# Patient Record
Sex: Male | Born: 1935 | Race: Black or African American | Hispanic: No | Marital: Married | Smoking: Never smoker
Health system: Southern US, Community
[De-identification: ages and names within clinical notes are randomized; demographics above are authoritative.]

## PROBLEM LIST (undated history)

## (undated) DIAGNOSIS — I4891 Unspecified atrial fibrillation: Secondary | ICD-10-CM

## (undated) DIAGNOSIS — I10 Essential (primary) hypertension: Secondary | ICD-10-CM

---

## 2010-12-29 ENCOUNTER — Ambulatory Visit: Payer: Self-pay | Admitting: Unknown Physician Specialty

## 2011-01-05 ENCOUNTER — Observation Stay: Payer: Self-pay | Admitting: Urology

## 2011-01-07 LAB — PATHOLOGY REPORT

## 2014-11-01 ENCOUNTER — Ambulatory Visit: Payer: Self-pay

## 2014-11-01 ENCOUNTER — Encounter (INDEPENDENT_AMBULATORY_CARE_PROVIDER_SITE_OTHER): Payer: Self-pay

## 2018-05-17 ENCOUNTER — Emergency Department: Payer: Medicare Other

## 2018-05-17 ENCOUNTER — Other Ambulatory Visit: Payer: Self-pay

## 2018-05-17 ENCOUNTER — Inpatient Hospital Stay
Admission: EM | Admit: 2018-05-17 | Discharge: 2018-05-23 | DRG: 309 | Disposition: A | Discharge status: Skilled Nursing Facility | Payer: Medicare Other | Attending: Internal Medicine | Admitting: Internal Medicine

## 2018-05-17 ENCOUNTER — Encounter: Payer: Self-pay | Admitting: Emergency Medicine

## 2018-05-17 DIAGNOSIS — Z7901 Long term (current) use of anticoagulants: Secondary | ICD-10-CM

## 2018-05-17 DIAGNOSIS — I959 Hypotension, unspecified: Secondary | ICD-10-CM | POA: Diagnosis not present

## 2018-05-17 DIAGNOSIS — S82141A Displaced bicondylar fracture of right tibia, initial encounter for closed fracture: Secondary | ICD-10-CM | POA: Diagnosis present

## 2018-05-17 DIAGNOSIS — I4819 Other persistent atrial fibrillation: Secondary | ICD-10-CM

## 2018-05-17 DIAGNOSIS — N179 Acute kidney failure, unspecified: Secondary | ICD-10-CM | POA: Diagnosis present

## 2018-05-17 DIAGNOSIS — M25061 Hemarthrosis, right knee: Secondary | ICD-10-CM | POA: Diagnosis present

## 2018-05-17 DIAGNOSIS — E785 Hyperlipidemia, unspecified: Secondary | ICD-10-CM | POA: Diagnosis present

## 2018-05-17 DIAGNOSIS — Z8249 Family history of ischemic heart disease and other diseases of the circulatory system: Secondary | ICD-10-CM | POA: Diagnosis not present

## 2018-05-17 DIAGNOSIS — R531 Weakness: Secondary | ICD-10-CM | POA: Diagnosis present

## 2018-05-17 DIAGNOSIS — Z1159 Encounter for screening for other viral diseases: Secondary | ICD-10-CM

## 2018-05-17 DIAGNOSIS — I5032 Chronic diastolic (congestive) heart failure: Secondary | ICD-10-CM | POA: Diagnosis present

## 2018-05-17 DIAGNOSIS — R55 Syncope and collapse: Secondary | ICD-10-CM | POA: Diagnosis present

## 2018-05-17 DIAGNOSIS — R609 Edema, unspecified: Secondary | ICD-10-CM

## 2018-05-17 DIAGNOSIS — Z7984 Long term (current) use of oral hypoglycemic drugs: Secondary | ICD-10-CM

## 2018-05-17 DIAGNOSIS — E119 Type 2 diabetes mellitus without complications: Secondary | ICD-10-CM | POA: Diagnosis present

## 2018-05-17 DIAGNOSIS — I110 Hypertensive heart disease with heart failure: Secondary | ICD-10-CM | POA: Diagnosis present

## 2018-05-17 DIAGNOSIS — R52 Pain, unspecified: Secondary | ICD-10-CM

## 2018-05-17 DIAGNOSIS — I482 Chronic atrial fibrillation, unspecified: Principal | ICD-10-CM | POA: Diagnosis present

## 2018-05-17 HISTORY — DX: Essential (primary) hypertension: I10

## 2018-05-17 LAB — COMPREHENSIVE METABOLIC PANEL
ALT: 20 U/L (ref 0–44)
AST: 20 U/L (ref 15–41)
Albumin: 3.8 g/dL (ref 3.5–5.0)
Alkaline Phosphatase: 91 U/L (ref 38–126)
Anion gap: 11 (ref 5–15)
BUN: 16 mg/dL (ref 8–23)
CO2: 26 mmol/L (ref 22–32)
Calcium: 9.3 mg/dL (ref 8.9–10.3)
Chloride: 102 mmol/L (ref 98–111)
Creatinine, Ser: 1.38 mg/dL — ABNORMAL HIGH (ref 0.61–1.24)
GFR calc Af Amer: 55 mL/min — ABNORMAL LOW (ref >60)
GFR calc non Af Amer: 48 mL/min — ABNORMAL LOW (ref >60)
Glucose, Bld: 129 mg/dL — ABNORMAL HIGH (ref 70–99)
Potassium: 4.2 mmol/L (ref 3.5–5.1)
Sodium: 139 mmol/L (ref 135–145)
Total Bilirubin: 0.8 mg/dL (ref 0.3–1.2)
Total Protein: 8.1 g/dL (ref 6.5–8.1)

## 2018-05-17 LAB — CBC WITH DIFFERENTIAL/PLATELET
Abs Immature Granulocytes: 0.03 10*3/uL (ref 0.00–0.07)
Basophils Absolute: 0 10*3/uL (ref 0.0–0.1)
Basophils Relative: 0 %
Eosinophils Absolute: 0.2 10*3/uL (ref 0.0–0.5)
Eosinophils Relative: 2 %
HCT: 46.3 % (ref 39.0–52.0)
Hemoglobin: 15.5 g/dL (ref 13.0–17.0)
Immature Granulocytes: 0 %
Lymphocytes Relative: 10 %
Lymphs Abs: 1.1 10*3/uL (ref 0.7–4.0)
MCH: 28.9 pg (ref 26.0–34.0)
MCHC: 33.5 g/dL (ref 30.0–36.0)
MCV: 86.2 fL (ref 80.0–100.0)
Monocytes Absolute: 0.8 10*3/uL (ref 0.1–1.0)
Monocytes Relative: 7 %
Neutro Abs: 8.9 10*3/uL — ABNORMAL HIGH (ref 1.7–7.7)
Neutrophils Relative %: 81 %
Platelets: 105 10*3/uL — ABNORMAL LOW (ref 150–400)
RBC: 5.37 MIL/uL (ref 4.22–5.81)
RDW: 14.1 % (ref 11.5–15.5)
WBC: 11.1 10*3/uL — ABNORMAL HIGH (ref 4.0–10.5)
nRBC: 0 % (ref 0.0–0.2)

## 2018-05-17 LAB — PROTIME-INR
INR: 1.6 — ABNORMAL HIGH (ref 0.8–1.2)
Prothrombin Time: 19.1 seconds — ABNORMAL HIGH (ref 11.4–15.2)

## 2018-05-17 LAB — TROPONIN I
Troponin I: <0.03 ng/mL (ref <0.03)
Troponin I: <0.03 ng/mL (ref <0.03)

## 2018-05-17 LAB — ETHANOL: Alcohol, Ethyl (B): <10 mg/dL (ref <10)

## 2018-05-17 MED ORDER — ONDANSETRON HCL 4 MG/2ML IJ SOLN: 4.0000 mg | Freq: Four times a day (QID) | INTRAMUSCULAR | Status: DC | PRN

## 2018-05-17 MED ORDER — FUROSEMIDE 20 MG PO TABS
20.0000 mg | ORAL_TABLET | Freq: Every day | ORAL | Status: DC
  Administered 2018-05-17 – 2018-05-23 (×7): 20 mg via ORAL
  Filled 2018-05-17 (×7): qty 1

## 2018-05-17 MED ORDER — DILTIAZEM HCL 100 MG IV SOLR
5.0000 mg/h | INTRAVENOUS | Status: DC
  Administered 2018-05-17: 16:00:00 5 mg/h via INTRAVENOUS
  Administered 2018-05-18: 10 mg/h via INTRAVENOUS
  Administered 2018-05-18: 7.5 mg/h via INTRAVENOUS
  Filled 2018-05-17 (×2): qty 100

## 2018-05-17 MED ORDER — ACETAMINOPHEN 325 MG PO TABS
650.0000 mg | ORAL_TABLET | Freq: Four times a day (QID) | ORAL | Status: DC | PRN
  Administered 2018-05-19: 650 mg via ORAL
  Filled 2018-05-17: qty 2

## 2018-05-17 MED ORDER — DILTIAZEM HCL 25 MG/5ML IV SOLN
5.0000 mg | Freq: Once | INTRAVENOUS | Status: AC
  Administered 2018-05-17: 15:00:00 5 mg via INTRAVENOUS

## 2018-05-17 MED ORDER — RIVAROXABAN 20 MG PO TABS
20.0000 mg | ORAL_TABLET | Freq: Every day | ORAL | Status: DC
  Administered 2018-05-17: 20 mg via ORAL
  Filled 2018-05-17: qty 1

## 2018-05-17 MED ORDER — DILTIAZEM HCL 25 MG/5ML IV SOLN
5.0000 mg | Freq: Once | INTRAVENOUS | Status: AC
  Administered 2018-05-17: 14:00:00 5 mg via INTRAVENOUS
  Filled 2018-05-17: qty 5

## 2018-05-17 MED ORDER — PRAVASTATIN SODIUM 20 MG PO TABS
20.0000 mg | ORAL_TABLET | Freq: Every day | ORAL | Status: DC
  Administered 2018-05-18 – 2018-05-22 (×5): 20 mg via ORAL
  Filled 2018-05-17 (×5): qty 1

## 2018-05-17 MED ORDER — DILTIAZEM HCL 25 MG/5ML IV SOLN
10.0000 mg | Freq: Once | INTRAVENOUS | Status: AC
  Administered 2018-05-17: 16:00:00 10 mg via INTRAVENOUS
  Filled 2018-05-17: qty 5

## 2018-05-17 MED ORDER — ACETAMINOPHEN 650 MG RE SUPP: 650.0000 mg | Freq: Four times a day (QID) | RECTAL | Status: DC | PRN

## 2018-05-17 MED ORDER — ONDANSETRON HCL 4 MG PO TABS: 4.0000 mg | ORAL_TABLET | Freq: Four times a day (QID) | ORAL | Status: DC | PRN

## 2018-05-17 MED ORDER — POLYETHYLENE GLYCOL 3350 17 G PO PACK: 17.0000 g | PACK | Freq: Every day | ORAL | Status: DC | PRN

## 2018-05-17 NOTE — Progress Notes (Signed)
Family Meeting Note  Advance Directive:yes  Today a meeting took place with the Patient.  Patient is able to participate.  The following clinical team members were present during this meeting:MD  The following were discussed:Patient's diagnosis: A-fib with RVR, Patient's progosis: Unable to determine and Goals for treatment: Full Code  Additional follow-up to be provided: prn  Time spent during discussion:20 minutes  Peter Sanchez D Lennis Rader, MD  

## 2018-05-17 NOTE — ED Provider Notes (Addendum)
[facility] Emergency Department Provider Note  ____________________________________________   First MD Initiated Contact with Patient 05/17/18 1541     (approximate)  I have reviewed the triage vital signs and the nursing notes.   HISTORY  Chief Complaint Near Syncope    HPI Peter Sanchez is a 84 y.o. male presents emergency department complaining of a syncopal episode while at home.  Patient's wife states that he was sitting on a walker and urinated on himself.  He has a history of chronic A. fib.  He denies any chest pain or shortness of breath.  He denies any nausea vomiting.    History reviewed. No pertinent past medical history.  There are no active problems to display for this patient.   History reviewed. No pertinent surgical history.  Prior to Admission medications   Not on File    Allergies Patient has no allergy information on record.  History reviewed. No pertinent family history.  Social History Social History   Tobacco Use  . Smoking status: Never Smoker  . Smokeless tobacco: Never Used  Substance Use Topics  . Alcohol use: Never    Frequency: Never  . Drug use: Never    Review of Systems  Constitutional: No fever/chills, positive syncopal episode Eyes: No visual changes. ENT: No sore throat. Respiratory: Denies cough Neovascular: Positive for history of A. fib Genitourinary: Negative for dysuria. Musculoskeletal: Negative for back pain. Skin: Negative for rash.    ____________________________________________   PHYSICAL EXAM:  VITAL SIGNS: ED Triage Vitals  Enc Vitals Group     BP 05/17/18 1400 (!) 147/110     Pulse Rate 05/17/18 1404 (!) 120     Resp 05/17/18 1400 18     Temp 05/17/18 1404 98.1 F (36.7 C)     Temp src --      SpO2 05/17/18 1401 100 %     Weight 05/17/18 1405 178 lb (80.7 kg)     Height 05/17/18 1405 5\' 8"  (1.727 m)     Head Circumference --      Peak Flow --      Pain Score  05/17/18 1405 0     Pain Loc --      Pain Edu? --      Excl. in GC? --     Constitutional: Alert and oriented. Well appearing and in no acute distress. Eyes: Conjunctivae are normal.  Head: Atraumatic. Nose: No congestion/rhinnorhea. Mouth/Throat: Mucous membranes are moist.   Neck:  supple no lymphadenopathy noted Cardiovascular: Irregular rate and rhythm heart sounds are normal Respiratory: Normal respiratory effort.  No retractions, lungs c t a  Abd: soft nontender bs normal all 4 quad GU: deferred Musculoskeletal: FROM all extremities, warm and well perfused Neurologic:  Normal speech and language.  Skin:  Skin is warm, dry and intact. No rash noted. Psychiatric: Mood and affect are normal. Speech and behavior are normal.  ____________________________________________   LABS (all labs ordered are listed, but only abnormal results are displayed)  Labs Reviewed  COMPREHENSIVE METABOLIC PANEL - Abnormal; Notable for the following components:      Result Value   Glucose, Bld 129 (*)    Creatinine, Ser 1.38 (*)    GFR calc non Af Amer 48 (*)    GFR calc Af Amer 55 (*)    All other components within normal limits  CBC WITH DIFFERENTIAL/PLATELET - Abnormal; Notable for the following components:   WBC 11.1 (*)    Platelets 105 (*)  Neutro Abs 8.9 (*)    All other components within normal limits  PROTIME-INR - Abnormal; Notable for the following components:   Prothrombin Time 19.1 (*)    INR 1.6 (*)    All other components within normal limits  ETHANOL  TROPONIN I  TROPONIN I   ____________________________________________   ____________________________________________  RADIOLOGY  CT of the head is negative for any acute abnormality  ____________________________________________   PROCEDURES  Procedure(s) performed: EKG shows rapid A. Fib Cardizem 5 mg given, heart rate continued to be elevated, repeat the Cardizem 5 mg for total of 10, heart rate did  decrease   Procedures    ____________________________________________   INITIAL IMPRESSION / ASSESSMENT AND PLAN / ED COURSE  Pertinent labs & imaging results that were available during my care of the patient were reviewed by me and considered in my medical decision making (see chart for details).   Patient is a 84 year old male presents emergency department syncopal episode.  History of chronic A. Fib.  Patient is on Xarelto and metoprolol which he has not taken today  Physical exam patient is alert and oriented.  He is not complaining of any pain. EKG shows rapid  A. Fib  Discussed patient case with Dr. Darnelle Catalan.  He agrees that we need to start the patient on Cardizem.  Labs were ordered, CT the head was ordered   Troponin is normal, CBC is elevated WBC of 11.1, INR is 1.6, comprehensive metabolic panel is basically normal  Patient was given a total of 10 mg of Cardizem and his heart rate did decrease.  However I am rechecked with the patient about it 30 minutes later the heart rate had increased again.  Discussed this with Dr. Darnelle Catalan.  We gave patient another 10 mg of Cardizem and started him on a drip.  He instructed me to have him admitted. Discussed case with the hospitalist.  She agrees to admit the patient.  Critical care time due to the A. fib with increased heart rate and infusions of Cardizem with titrations, critical care time is approximately 1 hour.    As part of my medical decision making, I reviewed the following data within the electronic MEDICAL RECORD NUMBER Nursing notes reviewed and incorporated, Labs reviewed the above, EKG interpreted atrial fibrillation, Old chart reviewed, Radiograph reviewed CT of the head is normal, Discussed with admitting physician hospitalist, Notes from prior ED visits and [place] Controlled Substance Database  ____________________________________________   FINAL CLINICAL IMPRESSION(S) / ED DIAGNOSES  Final diagnoses:  Syncope,  unspecified syncope type  Persistent atrial fibrillation      NEW MEDICATIONS STARTED DURING THIS VISIT:  New Prescriptions   No medications on file     Note:  This document was prepared using Dragon voice recognition software and may include unintentional dictation errors.    Faythe Ghee, PA-C 05/17/18 1649    Faythe Ghee, PA-C 05/17/18 1656    Arnaldo Natal, MD 05/22/18 [phone]

## 2018-05-17 NOTE — ED Notes (Signed)
Patient transported to CT 

## 2018-05-17 NOTE — ED Notes (Signed)
Pt unable to recall events, pt is A/O x4 at this time. Pt st he has a hx of Afib and takes Xarelto daily.  Pt denies CP, SHOB, dizziness at this time.

## 2018-05-17 NOTE — ED Notes (Signed)
ED TO INPATIENT HANDOFF REPORT  ED Nurse Name and Phone #:  Sanya Kobrin [phone]  S Name/Age/Gender Peter Sanchez 84 y.o. male Room/Bed: ED18A/ED18A  Code Status   Code Status: Not on file  Home/SNF/Other Home Patient oriented to: self, place, time and situation Is this baseline? Yes   Triage Complete: Triage complete  Chief Complaint syncopal episode  Triage Note Pt from home via [facility]. Per EMS pt was found in kitchen sitting on walker and unable to answer questions. Per pt's wife, pt passed out sitting on walker; pt urinated on himself.  Per EMS pt on Afib; BP 139/83; CBg 134.    Allergies Not on File  Level of Care/Admitting Diagnosis ED Disposition    ED Disposition Condition Comment   Admit  Hospital Area: [facility] [[id number]]  Level of Care: Telemetry [5]  Covid Evaluation: N/A  Diagnosis: Chronic atrial fibrillation with RVR [[id number]]  Admitting Physician: Willadean Carol DODD [[id number]]  Attending Physician: Willadean Carol DODD [[id number]]  Estimated length of stay: past midnight tomorrow  Certification:: I certify this patient will need inpatient services for at least 2 midnights  PT Class (Do Not Modify): Inpatient [101]  PT Acc Code (Do Not Modify): Private [1]       B Medical/Surgery History History reviewed. No pertinent past medical history. History reviewed. No pertinent surgical history.   A IV Location/Drains/Wounds Patient Lines/Drains/Airways Status   Active Line/Drains/Airways    Name:   Placement date:   Placement time:   Site:   Days:   Peripheral IV 05/17/18 Right Wrist   05/17/18    1451    Wrist   less than 1          Intake/Output Last 24 hours No intake or output data in the 24 hours ending 05/17/18 1723  Labs/Imaging Results for orders placed or performed during the hospital encounter of 05/17/18 (from the past 48 hour(s))  Comprehensive metabolic panel     Status: Abnormal   Collection Time: 05/17/18  2:11 PM  Result  Value Ref Range   Sodium 139 135 - 145 mmol/L   Potassium 4.2 3.5 - 5.1 mmol/L   Chloride 102 98 - 111 mmol/L   CO2 26 22 - 32 mmol/L   Glucose, Bld 129 (H) 70 - 99 mg/dL   BUN 16 8 - 23 mg/dL   Creatinine, Ser 9.56 (H) 0.61 - 1.24 mg/dL   Calcium 9.3 8.9 - 21.3 mg/dL   Total Protein 8.1 6.5 - 8.1 g/dL   Albumin 3.8 3.5 - 5.0 g/dL   AST 20 15 - 41 U/L   ALT 20 0 - 44 U/L   Alkaline Phosphatase 91 38 - 126 U/L   Total Bilirubin 0.8 0.3 - 1.2 mg/dL   GFR calc non Af Amer 48 (L) >60 mL/min   GFR calc Af Amer 55 (L) >60 mL/min   Anion gap 11 5 - 15    Comment: Performed at [facility], [address] [place], [place] [address]  Ethanol     Status: None   Collection Time: 05/17/18  2:11 PM  Result Value Ref Range   Alcohol, Ethyl (B) <10 <10 mg/dL    Comment: (NOTE) Lowest detectable limit for serum alcohol is 10 mg/dL. For medical purposes only. Performed at [facility], [address], [place], [place] [address]   Troponin I - Once     Status: None   Collection Time: 05/17/18  2:11 PM  Result  Value Ref Range   Troponin I <0.03 <0.03 ng/mL    Comment: Performed at [facility], [address] [place], [place] [address]  CBC with Differential     Status: Abnormal   Collection Time: 05/17/18  2:11 PM  Result Value Ref Range   WBC 11.1 (H) 4.0 - 10.5 K/uL   RBC 5.37 4.22 - 5.81 MIL/uL   Hemoglobin 15.5 13.0 - 17.0 g/dL   HCT 60.446.3 54.039.0 - 98.152.0 %   MCV 86.2 80.0 - 100.0 fL   MCH 28.9 26.0 - 34.0 pg   MCHC 33.5 30.0 - 36.0 g/dL   RDW 19.114.1 47.811.5 - 29.515.5 %   Platelets 105 (L) 150 - 400 K/uL    Comment: Immature Platelet Fraction may be clinically indicated, consider ordering this additional test [id number]    nRBC 0.0 0.0 - 0.2 %   Neutrophils Relative % 81 %   Neutro Abs 8.9 (H) 1.7 - 7.7 K/uL   Lymphocytes Relative 10 %   Lymphs Abs 1.1 0.7 - 4.0 K/uL   Monocytes Relative 7 %   Monocytes Absolute 0.8 0.1 - 1.0 K/uL   Eosinophils Relative 2  %   Eosinophils Absolute 0.2 0.0 - 0.5 K/uL   Basophils Relative 0 %   Basophils Absolute 0.0 0.0 - 0.1 K/uL   WBC Morphology MORPHOLOGY UNREMARKABLE    RBC Morphology MORPHOLOGY UNREMARKABLE    Smear Review MORPHOLOGY UNREMARKABLE    Immature Granulocytes 0 %   Abs Immature Granulocytes 0.03 0.00 - 0.07 K/uL    Comment: Performed at [facility], [address] [place], [place] [address]  Protime-INR     Status: Abnormal   Collection Time: 05/17/18  2:11 PM  Result Value Ref Range   Prothrombin Time 19.1 (H) 11.4 - 15.2 seconds   INR 1.6 (H) 0.8 - 1.2    Comment: (NOTE) INR goal varies based on device and disease states. Performed at [facility], [address] [place], [place] [address]    Ct Head Wo Contrast  Result Date: 05/17/2018 CLINICAL DATA:  Altered level of consciousness. EXAM: CT HEAD WITHOUT CONTRAST TECHNIQUE: Contiguous axial images were obtained from the base of the skull through the vertex without intravenous contrast. COMPARISON:  None. FINDINGS: Brain: Mild diffuse cortical atrophy is noted. Mild chronic ischemic white matter disease is noted. No mass effect or midline shift is noted. Ventricular size is within normal limits. There is no evidence of mass lesion, hemorrhage or acute infarction. Vascular: No hyperdense vessel or unexpected calcification. Skull: Normal. Negative for fracture or focal lesion. Sinuses/Orbits: No acute finding. Other: None. IMPRESSION: Mild diffuse cortical atrophy. Mild chronic ischemic white matter disease. No acute intracranial abnormality seen. Electronically Signed   By: Lupita RaiderJames  Green Jr M.D.   On: 05/17/2018 15:15    Pending Labs Unresulted Labs (From admission, onward)    Start     Ordered   05/17/18 1716  Troponin I - Once  Once,   STAT     05/17/18 1416   Signed and Held  Basic metabolic panel  Tomorrow morning,   R     Signed and Held   Signed and Held  CBC  Tomorrow morning,   R     Signed and Held    Signed and Held  Troponin I - Now Then Q6H  Now then every 6 hours,   R     Signed and Held  Vitals/Pain Today's Vitals   05/17/18 1440 05/17/18 1445 05/17/18 1615 05/17/18 1642  BP: (!) 162/114 (!) 148/109 (!) 155/111 (!) 150/108  Pulse: 95 92 97 (!) 110  Resp: 16 17 16  (!) 25  Temp:      SpO2: 100% 94% 99% 100%  Weight:      Height:      PainSc:        Isolation Precautions No active isolations  Medications Medications  diltiazem (CARDIZEM) 100 mg in dextrose 5 % 100 mL (1 mg/mL) infusion (7.5 mg/hr Intravenous Rate/Dose Change 05/17/18 1644)  diltiazem (CARDIZEM) injection 5 mg (5 mg Intravenous Given 05/17/18 1427)  diltiazem (CARDIZEM) injection 5 mg (5 mg Intravenous Given 05/17/18 1432)  diltiazem (CARDIZEM) injection 10 mg (10 mg Intravenous Given 05/17/18 1604)    Mobility uta Moderate fall risk   Focused Assessments Cardiac Assessment Handoff:  Cardiac Rhythm: Atrial fibrillation Lab Results  Component Value Date   TROPONINI <0.03 05/17/2018   No results found for: DDIMER Does the Patient currently have chest pain? No     R Recommendations: See Admitting Provider Note  Report given to:   Additional Notes: no complaints at this time. Wife has been told he is in the hospital

## 2018-05-17 NOTE — ED Triage Notes (Addendum)
Pt from home via [facility]. Per EMS pt was found in kitchen sitting on walker and unable to answer questions. Per pt's wife, pt passed out sitting on walker; pt urinated on himself.  Per EMS pt on Afib; BP 139/83; CBg 134.

## 2018-05-17 NOTE — H&P (Signed)
[facility] [place] [facility]   PATIENT NAME: Peter Sanchez    MR#:  [id number]  DATE OF BIRTH:  06/15/1934  DATE OF ADMISSION:  05/17/2018  PRIMARY CARE PHYSICIAN: Patient, No Pcp Per   REQUESTING/REFERRING PHYSICIAN: Greig Right, PA  CHIEF COMPLAINT:   Chief Complaint  Patient presents with  . Near Syncope    HISTORY OF PRESENT ILLNESS:  Peter Sanchez  is a 84 y.o. male with a known history of paroxysmal atrial fibrillation who presented to the ED with a syncopal episode this morning.  He states he passed out while sitting in a chair.  The next thing he knows, he woke up in an ambulance.  He denied any chest pain, lightheadedness, palpitations, nausea, sweating prior to the episode of syncope.  He thinks he was out for about 30 minutes.  He denies any head trauma.  In the ED, he was found to be in A. fib with RVR with heart rates in the low 100s.  Labs were unremarkable.  CT head was negative for acute abnormalities.  He was started on a diltiazem drip and hospitalists were called for admission.  PAST MEDICAL HISTORY:  Chronic atrial fibrillation Hypertension Hyperlipidemia Chronic diastolic congestive heart failure  PAST SURGICAL HISTORY:  History reviewed. No pertinent surgical history.  SOCIAL HISTORY:   Social History   Tobacco Use  . Smoking status: Never Smoker  . Smokeless tobacco: Never Used  Substance Use Topics  . Alcohol use: Never    Frequency: Never    FAMILY HISTORY:  Father-heart attack  DRUG ALLERGIES:  Not on File  REVIEW OF SYSTEMS:   Review of Systems  Constitutional: Negative for chills and fever.  HENT: Negative for congestion and sore throat.   Eyes: Negative for blurred vision and double vision.  Respiratory: Negative for cough and shortness of breath.   Cardiovascular: Negative for chest pain, palpitations and leg swelling.  Gastrointestinal: Negative for abdominal pain, nausea and vomiting.   Genitourinary: Negative for dysuria and urgency.  Musculoskeletal: Negative for back pain and neck pain.  Neurological: Positive for loss of consciousness. Negative for dizziness, focal weakness, seizures and headaches.    MEDICATIONS AT HOME:   Prior to Admission medications   Not on File      VITAL SIGNS:  Blood pressure (!) 148/109, pulse 92, temperature 98.1 F (36.7 C), resp. rate 17, height 5\' 8"  (1.727 m), weight 80.7 kg, SpO2 94 %.  PHYSICAL EXAMINATION:  Physical Exam  GENERAL:  84 y.o.-year-old patient lying in the bed with no acute distress.  EYES: Pupils equal, round, reactive to light and accommodation. No scleral icterus. Extraocular muscles intact.  HEENT: Head atraumatic, normocephalic. Oropharynx and nasopharynx clear.  NECK:  Supple, no jugular venous distention. No thyroid enlargement, no tenderness.  LUNGS: Normal breath sounds bilaterally, no wheezing, rales,rhonchi or crepitation. No use of accessory muscles of respiration.  CARDIOVASCULAR: Irregularly irregular rhythm, tachycardic, S1, S2 normal. No murmurs, rubs, or gallops.  ABDOMEN: Soft, nontender, nondistended. Bowel sounds present. No organomegaly or mass.  EXTREMITIES: No pedal edema, cyanosis, or clubbing.  NEUROLOGIC: Cranial nerves II through XII are intact. Muscle strength 5/5 in all extremities. Sensation intact. Gait not checked.  PSYCHIATRIC: The patient is alert and oriented x 3.  SKIN: No obvious rash, lesion, or ulcer.   LABORATORY PANEL:   CBC Recent Labs  Lab 05/17/18 1411  WBC 11.1*  HGB 15.5  HCT 46.3  PLT 105*   ------------------------------------------------------------------------------------------------------------------  Chemistries  Recent Labs  Lab 05/17/18 1411  NA 139  K 4.2  CL 102  CO2 26  GLUCOSE 129*  BUN 16  CREATININE 1.38*  CALCIUM 9.3  AST 20  ALT 20  ALKPHOS 91  BILITOT 0.8    ------------------------------------------------------------------------------------------------------------------  Cardiac Enzymes Recent Labs  Lab 05/17/18 1411  TROPONINI <0.03   ------------------------------------------------------------------------------------------------------------------  RADIOLOGY:  Ct Head Wo Contrast  Result Date: 05/17/2018 CLINICAL DATA:  Altered level of consciousness. EXAM: CT HEAD WITHOUT CONTRAST TECHNIQUE: Contiguous axial images were obtained from the base of the skull through the vertex without intravenous contrast. COMPARISON:  None. FINDINGS: Brain: Mild diffuse cortical atrophy is noted. Mild chronic ischemic white matter disease is noted. No mass effect or midline shift is noted. Ventricular size is within normal limits. There is no evidence of mass lesion, hemorrhage or acute infarction. Vascular: No hyperdense vessel or unexpected calcification. Skull: Normal. Negative for fracture or focal lesion. Sinuses/Orbits: No acute finding. Other: None. IMPRESSION: Mild diffuse cortical atrophy. Mild chronic ischemic white matter disease. No acute intracranial abnormality seen. Electronically Signed   By: Lupita RaiderJames  Green Jr M.D.   On: 05/17/2018 15:15      IMPRESSION AND PLAN:   Chronic atrial fibrillation with RVR -Continue diltiazem drip -Trend troponins -Check ECHO -Cardiology consult -Cardiac monitoring  Syncope-likely due to above -ECHO -Check orthostatic vitals in the morning -Cardiac monitoring  Chronic diastolic congestive heart failure- stable, no signs of volume overload -Continue home Lasix  Hypertension- mildly hypertensive -On diltiazem drip -Holding home metoprolol for now while on drip  Hyperlipidemia- stable -Continue home statin  Type 2 diabetes- takes metformin at home -Hold on SSI, as his last A1c was 6.1%  All the records are reviewed and case discussed with ED provider. Management plans discussed with the patient,  family and they are in agreement.  CODE STATUS: Full  TOTAL TIME TAKING CARE OF THIS PATIENT: 45 minutes.    Jinny BlossomKaty D  M.D on 05/17/2018 at 4:29 PM  Between 7am to 6pm - Pager - [phone]  After 6pm go to [email or web address] - Social research officer, governmentpassword [email or web address]  [facility] [place] [facility]  Office  [phone]  CC: Primary care physician; Patient, No Pcp Per   Note: This dictation was prepared with Dragon dictation along with smaller phrase technology. Any transcriptional errors that result from this process are unintentional.

## 2018-05-18 ENCOUNTER — Inpatient Hospital Stay
Admit: 2018-05-18 | Discharge: 2018-05-18 | Disposition: A | Discharge status: Home / Self Care | Payer: Medicare Other | Attending: Internal Medicine | Admitting: Internal Medicine

## 2018-05-18 LAB — BASIC METABOLIC PANEL WITH GFR
Anion gap: 13 (ref 5–15)
BUN: 18 mg/dL (ref 8–23)
CO2: 29 mmol/L (ref 22–32)
Calcium: 9.2 mg/dL (ref 8.9–10.3)
Chloride: 96 mmol/L — ABNORMAL LOW (ref 98–111)
Creatinine, Ser: 1.2 mg/dL (ref 0.61–1.24)
GFR calc Af Amer: >60 mL/min (ref >60)
GFR calc non Af Amer: 56 mL/min — ABNORMAL LOW (ref >60)
Glucose, Bld: 131 mg/dL — ABNORMAL HIGH (ref 70–99)
Potassium: 3.9 mmol/L (ref 3.5–5.1)
Sodium: 138 mmol/L (ref 135–145)

## 2018-05-18 LAB — CBC
HCT: 46.4 % (ref 39.0–52.0)
Hemoglobin: 15.3 g/dL (ref 13.0–17.0)
MCH: 28.4 pg (ref 26.0–34.0)
MCHC: 33 g/dL (ref 30.0–36.0)
MCV: 86.1 fL (ref 80.0–100.0)
Platelets: 97 10*3/uL — ABNORMAL LOW (ref 150–400)
RBC: 5.39 MIL/uL (ref 4.22–5.81)
RDW: 14 % (ref 11.5–15.5)
WBC: 9.7 10*3/uL (ref 4.0–10.5)
nRBC: 0 % (ref 0.0–0.2)

## 2018-05-18 LAB — ECHOCARDIOGRAM COMPLETE
Height: 68 in
Weight: 2634.94 oz

## 2018-05-18 LAB — TROPONIN I
Troponin I: <0.03 ng/mL (ref <0.03)
Troponin I: <0.03 ng/mL (ref <0.03)

## 2018-05-18 MED ORDER — DILTIAZEM HCL 25 MG/5ML IV SOLN
15.0000 mg | Freq: Once | INTRAVENOUS | Status: AC
  Administered 2018-05-18: 15 mg via INTRAVENOUS
  Filled 2018-05-18: qty 5

## 2018-05-18 MED ORDER — HYDRALAZINE HCL 20 MG/ML IJ SOLN
5.0000 mg | INTRAMUSCULAR | Status: DC | PRN
  Administered 2018-05-18: 22:00:00 5 mg via INTRAVENOUS
  Filled 2018-05-18 (×2): qty 1

## 2018-05-18 MED ORDER — METOPROLOL TARTRATE 50 MG PO TABS: 50.0000 mg | ORAL_TABLET | Freq: Two times a day (BID) | ORAL | Status: DC

## 2018-05-18 MED ORDER — RIVAROXABAN 15 MG PO TABS
15.0000 mg | ORAL_TABLET | Freq: Every day | ORAL | Status: DC
  Administered 2018-05-18 – 2018-05-20 (×3): 15 mg via ORAL
  Filled 2018-05-18 (×4): qty 1

## 2018-05-18 MED ORDER — METOPROLOL SUCCINATE ER 100 MG PO TB24
100.0000 mg | ORAL_TABLET | Freq: Every day | ORAL | Status: DC
  Administered 2018-05-18 – 2018-05-23 (×6): 100 mg via ORAL
  Filled 2018-05-18 (×6): qty 1

## 2018-05-18 MED ORDER — METOPROLOL TARTRATE 5 MG/5ML IV SOLN
5.0000 mg | Freq: Once | INTRAVENOUS | Status: AC
  Administered 2018-05-18: 5 mg via INTRAVENOUS
  Filled 2018-05-18: qty 5

## 2018-05-18 NOTE — Progress Notes (Signed)
*  PRELIMINARY RESULTS* Echocardiogram 2D Echocardiogram has been performed.  Cristela Blue 05/18/2018, 8:53 AM

## 2018-05-18 NOTE — Consult Note (Addendum)
[facility]  CARDIOLOGY CONSULT NOTE  Patient ID: Peter Sanchez MRN: [id number] DOB/AGE: [birth year] 84 y.o.  Admit date: 05/17/2018 Referring Physician Willadean CarolMayo, Katy, MD  Primary Physician Jerl MinaHedrick, Schneider, MD Primary Cardiologist Arnoldo HookerKowalski, Alvester Eads, MD  Reason for Consultation A-fib with RVR   HPI:  Peter Sanchez is a 84 y.o. with a past medical history of permanent atrial fibrillation, anticoagulated on Xarelto, heart failure with preserved ejection fraction, last ECHO from 06/2015 with EF of >55%, HTN, and HLD, who presented to the ED yesterday after an episode of syncope. His wife found him unconscious in his chair after having urinated on himself, prompting her to call EMS. He regained consciousness en route to the ED, where he was found to be in atrial fibrillation with RVR. The patient reports feeling generally well prior to this event. He was reportedly in his usual state of health yesterday morning aside from worsened left knee pain. However, he does report having a similar episode on Monday where his wife told him he had briefly lost consciousness. Since he had felt well at that time, he did not think much of it. He denies having any other recent illnesses - no cough, fever, chills, nausea, vomiting, abdominal pain, or dysuria. He reports remaining compliant on all of his medications without any recent missed doses. Blood pressure has been well controlled up to this point with average readings in the 120/70 range.   ER work up included negative troponins x3, CBC which was notable for mildly elevated WBC to 11, creatinine bump to 1.3 (up from baseline of 1.1). No UA or CXR were done to evaluate for possible infectious etiologies. His heart rate was in the 120s range at presentation, so he was started on a diltiazem drip, which he remains on now at 5 mg/hour, however heart rate is still around 100 BPM during evaluation this morning.   Of note, the patient was scheduled for a routine follow up  visit with us in the cardiologist office yesterday. He called in to our office in the morning saying he was unable to make it to his appointment due to his knee pain. We then scheduled a telemedicine follow up visit for later in the day, however, when we called his home his wife reported to us that he had been taken to the ER.  Review of systems complete and found to be negative unless listed above   Past Medical History:  Diagnosis Date  . Hypertension     History reviewed. No pertinent surgical history.  Medications Prior to Admission  Medication Sig Dispense Refill Last Dose  . diltiazem (CARDIZEM CD) 180 MG 24 hr capsule Take 180 mg by mouth daily.     . metFORMIN (GLUCOPHAGE) 500 MG tablet Take 500 mg by mouth daily.   05/17/2018 at Unknown time  . metoprolol succinate (TOPROL-XL) 50 MG 24 hr tablet Take 50 mg by mouth daily.     . pravastatin (PRAVACHOL) 20 MG tablet Take 20 mg by mouth daily.     . rivaroxaban (XARELTO) 20 MG TABS tablet TAKE 1 TABLET BY MOUTH EVERY DAY WITH DINNER   05/17/2018 at Unknown time  . empagliflozin (JARDIANCE) 10 MG TABS tablet Take 10 mg by mouth daily.     . furosemide (LASIX) 20 MG tablet Take 20 mg by mouth daily.      Social History   Socioeconomic History  . Marital status: Married    Spouse name: Not on file  . Number of  children: Not on file  . Years of education: Not on file  . Highest education level: Not on file  Occupational History  . Not on file  Social Needs  . Financial resource strain: Not on file  . Food insecurity:    Worry: Not on file    Inability: Not on file  . Transportation needs:    Medical: Not on file    Non-medical: Not on file  Tobacco Use  . Smoking status: Never Smoker  . Smokeless tobacco: Never Used  Substance and Sexual Activity  . Alcohol use: Never    Frequency: Never  . Drug use: Never  . Sexual activity: Not on file  Lifestyle  . Physical activity:    Days per week: Not on file    Minutes per  session: Not on file  . Stress: Not on file  Relationships  . Social connections:    Talks on phone: Not on file    Gets together: Not on file    Attends religious service: Not on file    Active member of club or organization: Not on file    Attends meetings of clubs or organizations: Not on file    Relationship status: Not on file  . Intimate partner violence:    Fear of current or ex partner: Not on file    Emotionally abused: Not on file    Physically abused: Not on file    Forced sexual activity: Not on file  Other Topics Concern  . Not on file  Social History Narrative  . Not on file    History reviewed. No pertinent family history.   Review of systems complete and found to be negative unless listed above    PHYSICAL EXAM  General: Well developed, well nourished, in no acute distress HEENT:  Normocephalic and atramatic Neck:  No JVD.  Lungs: Clear bilaterally to auscultation and percussion. Heart: Distant heart sounds. Irregularly irregular rhythm, rapid rate to 100 BPM.  Extremities: No clubbing, cyanosis or edema.  Extremities are warm and well perfused.  Neuro: Alert and oriented X 3. Psych:  Good affect, responds appropriately  Labs:   Lab Results  Component Value Date   WBC 9.7 05/18/2018   HGB 15.3 05/18/2018   HCT 46.4 05/18/2018   MCV 86.1 05/18/2018   PLT 97 (L) 05/18/2018    Recent Labs  Lab 05/17/18 1411 05/18/18 0632  NA 139 138  K 4.2 3.9  CL 102 96*  CO2 26 29  BUN 16 18  CREATININE 1.38* 1.20  CALCIUM 9.3 9.2  PROT 8.1  --   BILITOT 0.8  --   ALKPHOS 91  --   ALT 20  --   AST 20  --   GLUCOSE 129* 131*   Lab Results  Component Value Date   TROPONINI <0.03 05/18/2018   No results found for: CHOL No results found for: HDL No results found for: LDLCALC No results found for: TRIG No results found for: CHOLHDL No results found for: LDLDIRECT    Radiology: Ct Head Wo Contrast  Result Date: 05/17/2018 CLINICAL DATA:  Altered  level of consciousness. EXAM: CT HEAD WITHOUT CONTRAST TECHNIQUE: Contiguous axial images were obtained from the base of the skull through the vertex without intravenous contrast. COMPARISON:  None. FINDINGS: Brain: Mild diffuse cortical atrophy is noted. Mild chronic ischemic white matter disease is noted. No mass effect or midline shift is noted. Ventricular size is within normal limits. There is no evidence  of mass lesion, hemorrhage or acute infarction. Vascular: No hyperdense vessel or unexpected calcification. Skull: Normal. Negative for fracture or focal lesion. Sinuses/Orbits: No acute finding. Other: None. IMPRESSION: Mild diffuse cortical atrophy. Mild chronic ischemic white matter disease. No acute intracranial abnormality seen. Electronically Signed   By: Lupita Raider M.D.   On: 05/17/2018 15:15    EKG reviewed by me:  Atrial fibrillation, Rate of 127 BPM, normal axis, normal intervals, no evidence of ST-T wave changes that would be consistent with ischemia  ASSESSMENT AND PLAN:  Mr. Slutzky is a 84 year old male with a significant past medical history of permanent atrial fibrillation, anticoagulated on Xarelto, and heart failure with preserved ejection fraction, last ECHO from 06/2015 with EF of >55%, who presented after a presumed syncopal episode yesterday afternoon, then found to be in A-fib with RVR in the ED with rate of 120s. He had no preceding chest pain or other cardiovascular symptoms. Patient continues to feel well today. Troponin negative x3. EKG without evidence of ischemia. No concerns for ACS at this time. Patient now on diltiazem drip with heart rate improvement to 100 BPM. Heart rate previously well controlled with metoprolol and diltiazem PO. Unclear what triggered rapid ventricular response. Patient without any infectious symptoms, however initial CBC with elevated white count to 11. He also had a mild AKI with creatinine of 1.38, up from baseline of 1.1 on ED presentation.    Plan:  1. Continue rate control with diltiazem drip at 5 mg / hour. Add home dose of PO metoprolol succinate 50 mg for improved heart rate control. And increase to 100mg  as needed 2. No further cardiac diagnostics indicated at this time.  3. Follow up with cardiology as an outpatient for further medication management  4.continue anticoagulation without interruption 5. Ok for dc to home if ambulating well with above  I have examined and interviewd above patient and agree with above plan Arnoldo Hooker Signed: Harrell Gave PA-C 05/18/2018, 8:15 AM

## 2018-05-18 NOTE — Progress Notes (Signed)
[facility] - [place] at [facility]   PATIENT NAME: Peter Sanchez    MR#:  [id number]  DATE OF BIRTH:  1934-06-27  SUBJECTIVE:  CHIEF COMPLAINT: Patient denies any palpitations or dizzy spells.  On Cardizem drip  REVIEW OF SYSTEMS:  CONSTITUTIONAL: No fever, fatigue or weakness.  EYES: No blurred or double vision.  EARS, NOSE, AND THROAT: No tinnitus or ear pain.  RESPIRATORY: No cough, shortness of breath, wheezing or hemoptysis.  CARDIOVASCULAR: No chest pain, orthopnea, edema.  GASTROINTESTINAL: No nausea, vomiting, diarrhea or abdominal pain.  GENITOURINARY: No dysuria, hematuria.  ENDOCRINE: No polyuria, nocturia,  HEMATOLOGY: No anemia, easy bruising or bleeding SKIN: No rash or lesion. MUSCULOSKELETAL: No joint pain or arthritis.   NEUROLOGIC: No tingling, numbness, weakness.  PSYCHIATRY: No anxiety or depression.   DRUG ALLERGIES:  No Known Allergies  VITALS:  Blood pressure (!) 76/60, pulse 73, temperature 97.9 F (36.6 C), temperature source Oral, resp. rate 20, height 5\' 8"  (1.727 m), weight 74.7 kg, SpO2 99 %.  PHYSICAL EXAMINATION:  GENERAL:  84 y.o.-year-old patient lying in the bed with no acute distress.  EYES: Pupils equal, round, reactive to light and accommodation. No scleral icterus. Extraocular muscles intact.  HEENT: Head atraumatic, normocephalic. Oropharynx and nasopharynx clear.  NECK:  Supple, no jugular venous distention. No thyroid enlargement, no tenderness.  LUNGS: Normal breath sounds bilaterally, no wheezing, rales,rhonchi or crepitation. No use of accessory muscles of respiration.  CARDIOVASCULAR: Irregularly irregular no murmurs, rubs, or gallops.  ABDOMEN: Soft, nontender, nondistended. Bowel sounds present.  EXTREMITIES: No pedal edema, cyanosis, or clubbing.  NEUROLOGIC: Awake, alert and oriented x3  Sensation intact. Gait not checked.  PSYCHIATRIC: The patient is alert and oriented x 3.  SKIN: No obvious rash,  lesion, or ulcer.    LABORATORY PANEL:   CBC Recent Labs  Lab 05/18/18 0632  WBC 9.7  HGB 15.3  HCT 46.4  PLT 97*   ------------------------------------------------------------------------------------------------------------------  Chemistries  Recent Labs  Lab 05/17/18 1411 05/18/18 0632  NA 139 138  K 4.2 3.9  CL 102 96*  CO2 26 29  GLUCOSE 129* 131*  BUN 16 18  CREATININE 1.38* 1.20  CALCIUM 9.3 9.2  AST 20  --   ALT 20  --   ALKPHOS 91  --   BILITOT 0.8  --    ------------------------------------------------------------------------------------------------------------------  Cardiac Enzymes Recent Labs  Lab 05/18/18 2633  TROPONINI <0.03   ------------------------------------------------------------------------------------------------------------------  RADIOLOGY:  Ct Head Wo Contrast  Result Date: 05/17/2018 CLINICAL DATA:  Altered level of consciousness. EXAM: CT HEAD WITHOUT CONTRAST TECHNIQUE: Contiguous axial images were obtained from the base of the skull through the vertex without intravenous contrast. COMPARISON:  None. FINDINGS: Brain: Mild diffuse cortical atrophy is noted. Mild chronic ischemic white matter disease is noted. No mass effect or midline shift is noted. Ventricular size is within normal limits. There is no evidence of mass lesion, hemorrhage or acute infarction. Vascular: No hyperdense vessel or unexpected calcification. Skull: Normal. Negative for fracture or focal lesion. Sinuses/Orbits: No acute finding. Other: None. IMPRESSION: Mild diffuse cortical atrophy. Mild chronic ischemic white matter disease. No acute intracranial abnormality seen. Electronically Signed   By: Lupita Raider M.D.   On: 05/17/2018 15:15    EKG:   Orders placed or performed during the hospital encounter of 05/17/18  . ED EKG  . ED EKG  . EKG 12-Lead  . EKG 12-Lead  . EKG    ASSESSMENT AND PLAN:  atrial fibrillation with RVR -Continue diltiazem  drip, wean off as tolerated -Trend troponins -Check ECHO -Cardiology consult-kc; Dr. Gwen PoundsKowalski is following -Cardiac monitoring  Syncope-likely due to above -ECHO -Check orthostatic vitals in the morning -Cardiac monitoring  Chronic diastolic congestive heart failure- stable, no signs of volume overload -Continue home Lasix  Hypertension-currently hypotensive -On diltiazem drip will wean off as tolerated -Resuming home medication metoprolol with holding parameters -Cardizem p.o. home medication on hold  Hyperlipidemia- stable -Continue home statin  Type 2 diabetes- takes metformin at home -Hold on SSI, as his last A1c was 6.1%    All the records are reviewed and case discussed with Care Management/Social Workerr. Management plans discussed with the patient, she is  in agreement.  CODE STATUS: FC  TOTAL TIME TAKING CARE OF THIS PATIENT: 35 minutes.   POSSIBLE D/C IN 1-2 DAYS, DEPENDING ON CLINICAL CONDITION.  Note: This dictation was prepared with Dragon dictation along with smaller phrase technology. Any transcriptional errors that result from this process are unintentional.   Ramonita LabAruna Thy Gullikson M.D on 05/18/2018 at 11:43 AM  Between 7am to 6pm - Pager - [phone] After 6pm go to [email or web address] - password [email or web address]  [facility]  Office  [phone]  CC: Primary care physician; Patient, No Pcp Per

## 2018-05-18 NOTE — Progress Notes (Addendum)
Patient BP 171/120. HR has been sustaining in 120's while patient is resting in bedd. MD Mayo notified. MD to place orders for Metoprolol and hydralazine. Per MD give both medications. Will give as ordered and continue to monitor.   Update: Patient HR continues to sustain 120-130s. MD Mayo made aware, MD to place orders.   Update: Patient HR still sustaining 125-140s despite receiving IV metoprolol and cardizem. MD Mayo notified. MD to place orders to restart cardizem gtt.  Mayra Neer M

## 2018-05-18 NOTE — Plan of Care (Signed)
  Problem: Clinical Measurements: Goal: Will remain free from infection Outcome: Progressing Goal: Respiratory complications will improve Outcome: Progressing   Problem: Activity: Goal: Risk for activity intolerance will decrease Outcome: Progressing   Problem: Education: Goal: Understanding of medication regimen will improve Outcome: Progressing

## 2018-05-18 NOTE — Progress Notes (Signed)
Dr Amado Coe made aware of + orthostatic VS- pt asymptomatic / will monitor.

## 2018-05-18 NOTE — Care Management (Signed)
Low risk for readmission. No CM consult ordered. Chronic xarelto. Chronic atrial fib. Diltiazem drip.  No discharge needs reported by members of the care team

## 2018-05-19 LAB — GLUCOSE, CAPILLARY: Glucose-Capillary: 106 mg/dL — ABNORMAL HIGH (ref 70–99)

## 2018-05-19 LAB — HEMOGLOBIN A1C
Hgb A1c MFr Bld: 6.1 % — ABNORMAL HIGH (ref 4.8–5.6)
Mean Plasma Glucose: 128.37 mg/dL

## 2018-05-19 MED ORDER — INSULIN ASPART 100 UNIT/ML ~~LOC~~ SOLN: 0.0000 [IU] | Freq: Every day | SUBCUTANEOUS | Status: DC

## 2018-05-19 MED ORDER — DILTIAZEM HCL-DEXTROSE 100-5 MG/100ML-% IV SOLN (PREMIX)
5.0000 mg/h | INTRAVENOUS | Status: DC
  Administered 2018-05-19: 5 mg/h via INTRAVENOUS
  Administered 2018-05-19: 12.5 mg/h via INTRAVENOUS
  Administered 2018-05-19: 10 mg/h via INTRAVENOUS
  Administered 2018-05-19: 12.5 mg/h via INTRAVENOUS
  Filled 2018-05-19 (×3): qty 100

## 2018-05-19 MED ORDER — INSULIN ASPART 100 UNIT/ML ~~LOC~~ SOLN
0.0000 [IU] | Freq: Three times a day (TID) | SUBCUTANEOUS | Status: DC
  Administered 2018-05-20 – 2018-05-23 (×2): 1 [IU] via SUBCUTANEOUS
  Filled 2018-05-19 (×2): qty 1

## 2018-05-19 MED ORDER — DILTIAZEM LOAD VIA INFUSION
10.0000 mg | Freq: Once | INTRAVENOUS | Status: AC
  Administered 2018-05-19: 10 mg via INTRAVENOUS
  Filled 2018-05-19: qty 10

## 2018-05-19 NOTE — Plan of Care (Signed)
  Problem: Education: Goal: Knowledge of General Education information will improve Description Including pain rating scale, medication(s)/side effects and non-pharmacologic comfort measures Outcome: Progressing   Problem: Health Behavior/Discharge Planning: Goal: Ability to manage health-related needs will improve Outcome: Progressing   Problem: Clinical Measurements: Goal: Ability to maintain clinical measurements within normal limits will improve Outcome: Progressing Goal: Will remain free from infection Outcome: Progressing Note:  Remains afebrile   Problem: Education: Goal: Understanding of medication regimen will improve Outcome: Progressing Note:  Titrated off Cardizem gtt today

## 2018-05-19 NOTE — Progress Notes (Signed)
Heart rate in 80's, Cardizem turned down to 7.5mg /hr, will continue to monitor.

## 2018-05-19 NOTE — Progress Notes (Signed)
Spoke with Dr. Amado Coe, pt bp 98/69 will discontinue gtt right now, will hold am Lasix and Metoprolol till later in day when BP comes up a little higher

## 2018-05-19 NOTE — Progress Notes (Signed)
[facility] [place] [facility]   PATIENT NAME: Peter HaggardJames Sanchez    MR#:  [id number]  DATE OF BIRTH:  December 12, 1934  SUBJECTIVE:  CHIEF COMPLAINT: Patient denies any palpitations or dizzy spells.  Restarted on Cardizem drip last night for RVR .  Weaning off Cardizem drip patient is hypotensive today  REVIEW OF SYSTEMS:  CONSTITUTIONAL: No fever, fatigue or weakness.  EYES: No blurred or double vision.  EARS, NOSE, AND THROAT: No tinnitus or ear pain.  RESPIRATORY: No cough, shortness of breath, wheezing or hemoptysis.  CARDIOVASCULAR: No chest pain, orthopnea, edema.  GASTROINTESTINAL: No nausea, vomiting, diarrhea or abdominal pain.  GENITOURINARY: No dysuria, hematuria.  ENDOCRINE: No polyuria, nocturia,  HEMATOLOGY: No anemia, easy bruising or bleeding SKIN: No rash or lesion. MUSCULOSKELETAL: No joint pain or arthritis.   NEUROLOGIC: No tingling, numbness, weakness.  PSYCHIATRY: No anxiety or depression.   DRUG ALLERGIES:  No Known Allergies  VITALS:  Blood pressure 112/83, pulse 96, temperature (!) 97.5 F (36.4 C), temperature source Oral, resp. rate 17, height 5\' 8"  (1.727 m), weight 76.4 kg, SpO2 100 %.  PHYSICAL EXAMINATION:  GENERAL:  84 y.o.-year-old patient lying in the bed with no acute distress.  EYES: Pupils equal, round, reactive to light and accommodation. No scleral icterus. Extraocular muscles intact.  HEENT: Head atraumatic, normocephalic. Oropharynx and nasopharynx clear.  NECK:  Supple, no jugular venous distention. No thyroid enlargement, no tenderness.  LUNGS: Normal breath sounds bilaterally, no wheezing, rales,rhonchi or crepitation. No use of accessory muscles of respiration.  CARDIOVASCULAR: Irregularly irregular no murmurs, rubs, or gallops.  ABDOMEN: Soft, nontender, nondistended. Bowel sounds present.  EXTREMITIES: No pedal edema, cyanosis, or clubbing.  NEUROLOGIC: Awake, alert and oriented x3  Sensation intact. Gait not  checked.  PSYCHIATRIC: The patient is alert and oriented x 3.  SKIN: No obvious rash, lesion, or ulcer.    LABORATORY PANEL:   CBC Recent Labs  Lab 05/18/18 0632  WBC 9.7  HGB 15.3  HCT 46.4  PLT 97*   ------------------------------------------------------------------------------------------------------------------  Chemistries  Recent Labs  Lab 05/17/18 1411 05/18/18 0632  NA 139 138  K 4.2 3.9  CL 102 96*  CO2 26 29  GLUCOSE 129* 131*  BUN 16 18  CREATININE 1.38* 1.20  CALCIUM 9.3 9.2  AST 20  --   ALT 20  --   ALKPHOS 91  --   BILITOT 0.8  --    ------------------------------------------------------------------------------------------------------------------  Cardiac Enzymes Recent Labs  Lab 05/18/18 [id number]  TROPONINI <0.03   ------------------------------------------------------------------------------------------------------------------  RADIOLOGY:  Ct Head Wo Contrast  Result Date: 05/17/2018 CLINICAL DATA:  Altered level of consciousness. EXAM: CT HEAD WITHOUT CONTRAST TECHNIQUE: Contiguous axial images were obtained from the base of the skull through the vertex without intravenous contrast. COMPARISON:  None. FINDINGS: Brain: Mild diffuse cortical atrophy is noted. Mild chronic ischemic white matter disease is noted. No mass effect or midline shift is noted. Ventricular size is within normal limits. There is no evidence of mass lesion, hemorrhage or acute infarction. Vascular: No hyperdense vessel or unexpected calcification. Skull: Normal. Negative for fracture or focal lesion. Sinuses/Orbits: No acute finding. Other: None. IMPRESSION: Mild diffuse cortical atrophy. Mild chronic ischemic white matter disease. No acute intracranial abnormality seen. Electronically Signed   By: Lupita RaiderJames  Green Jr M.D.   On: 05/17/2018 15:15    EKG:   Orders placed or performed during the hospital encounter of 05/17/18  . ED EKG  . ED EKG  .  EKG 12-Lead  . EKG 12-Lead  .  EKG    ASSESSMENT AND PLAN:    atrial fibrillation with RVR -Continue diltiazem drip, wean off as tolerated -Acute MI ruled out with negative troponins x3 - ECHO 55 to 60% EF.  Cavity size was normal. -Cardiology consult-kc; Dr. Gwen Pounds is following -Cardiac monitoring On Xarelto  Syncope-likely due to above -ECHO-55 to 60% EF -Orthostatics noticed -Cardiac monitoring  Chronic diastolic congestive heart failure- stable, no signs of volume overload -Continue home Lasix with holding parameters  Hypertension-currently hypotensive -On diltiazem drip will wean off as tolerated -Resuming home medication metoprolol and Toprol dose increased 200 mg once daily with holding parameters -Cardizem p.o. home medication on hold  Hyperlipidemia- stable -Continue home statin  Type 2 diabetes- takes metformin at home - SSI,  his last A1c was 6.1%    All the records are reviewed and case discussed with Care Management/Social Workerr. Management plans discussed with the patient, she is  in agreement.  CODE STATUS: FC  TOTAL TIME TAKING CARE OF THIS PATIENT: 35 minutes.   POSSIBLE D/C IN 1-2 DAYS, DEPENDING ON CLINICAL CONDITION.  Note: This dictation was prepared with Dragon dictation along with smaller phrase technology. Any transcriptional errors that result from this process are unintentional.   Ramonita Lab M.D on 05/19/2018 at 12:52 PM  Between 7am to 6pm - Pager - [phone] After 6pm go to [email or web address] - password [email or web address]  [facility]  Office  [phone]  CC: Primary care physician; Patient, No Pcp Per

## 2018-05-19 NOTE — Plan of Care (Signed)
  Problem: Cardiac: Goal: Ability to achieve and maintain adequate cardiopulmonary perfusion will improve Outcome: Not Progressing Note:  HR continues to sustain 120-130's despite receiving IV metoprolol and Cardizem. Cardizem drip restarted.

## 2018-05-19 NOTE — Progress Notes (Signed)
Patient has been in A-fib with RVR throughout this evening, after coming off the diltiazem drip earlier today. HRs continue to be elevated to the 130s despite doses of IV metoprolol and IV diltiazem. Will restart diltiazem drip and can hopefully transition to po medications in the morning.  Willadean Carol, MD

## 2018-05-19 NOTE — Progress Notes (Signed)
Heart rate in mid 90's, Cardizem turned down to 10mg /hr.  Will continue to monitor.

## 2018-05-20 ENCOUNTER — Inpatient Hospital Stay: Payer: Medicare Other

## 2018-05-20 LAB — GLUCOSE, CAPILLARY
Glucose-Capillary: 120 mg/dL — ABNORMAL HIGH (ref 70–99)
Glucose-Capillary: 134 mg/dL — ABNORMAL HIGH (ref 70–99)
Glucose-Capillary: 114 mg/dL — ABNORMAL HIGH (ref 70–99)
Glucose-Capillary: 137 mg/dL — ABNORMAL HIGH (ref 70–99)

## 2018-05-20 MED ORDER — DILTIAZEM HCL 30 MG PO TABS
60.0000 mg | ORAL_TABLET | Freq: Four times a day (QID) | ORAL | Status: DC
  Administered 2018-05-20: 60 mg via ORAL
  Filled 2018-05-20: qty 2

## 2018-05-20 MED ORDER — METOPROLOL TARTRATE 5 MG/5ML IV SOLN
5.0000 mg | Freq: Once | INTRAVENOUS | Status: AC
  Administered 2018-05-20: 5 mg via INTRAVENOUS
  Filled 2018-05-20: qty 5

## 2018-05-20 MED ORDER — DIGOXIN 0.25 MG/ML IJ SOLN
0.2500 mg | Freq: Once | INTRAMUSCULAR | Status: AC
  Administered 2018-05-20: 0.25 mg via INTRAVENOUS
  Filled 2018-05-20: qty 2

## 2018-05-20 MED ORDER — DILTIAZEM LOAD VIA INFUSION
10.0000 mg | Freq: Once | INTRAVENOUS | Status: DC
  Filled 2018-05-20 (×2): qty 10

## 2018-05-20 MED ORDER — DILTIAZEM HCL ER COATED BEADS 180 MG PO CP24
300.0000 mg | ORAL_CAPSULE | Freq: Every day | ORAL | Status: DC
  Administered 2018-05-20 – 2018-05-23 (×4): 300 mg via ORAL
  Filled 2018-05-20 (×3): qty 1

## 2018-05-20 NOTE — Plan of Care (Signed)
  Problem: Clinical Measurements: Goal: Ability to maintain clinical measurements within normal limits will improve Outcome: Progressing Goal: Respiratory complications will improve Outcome: Progressing   

## 2018-05-20 NOTE — Evaluation (Signed)
Physical Therapy Evaluation Patient Details Name: Peter Sanchez MRN: [id number] DOB: August 06, 1934 Today's Date: 05/20/2018   History of Present Illness  Patient is a pleasant 84 year old male who was found to have fallen/passed out at home. Reports pain in R knee since fall. Per EMS patient was found to be in A fib.  Upon entering ED patient was found to be in A fib with RVR and HR in low 100s. Patient has PMH of CAI, HTN, HLD, chronic diastolic HF. Reports he hasn't been able to move R knee since he fell on Wednesday   Clinical Impression  Patient is a pleasant 84 year old male who presents with diffuse swelling/edema of R knee, warmth upon touch to right knee, pain with palpation, inability to bear weight on RLE, and inability to fully extend/flex R knee. Due to [facility] Knee rules patient's nurse informed for potential consult to ortho and/or imaging. Nurse agreeable and called physician. Patient's mobility status was unable to be obtained due to above mentioned reason and patient would benefit from trial period of 3-4 sessions of physical therapy to assess mobility for placement options for discharge once R knee has been cleared. Skilled physical therapy will be beneficial for patient while in hospital to increase strength, decrease falls risk, and improve mobility.    Follow Up Recommendations Home health PT(patient will benefit from trial period of 3-4 sessions to assess mobility once cleared for R knee. )    Equipment Recommendations  Other (comment)(showerchair/bedside commode)    Recommendations for Other Services       Precautions / Restrictions Precautions Precautions: Fall Restrictions Other Position/Activity Restrictions: none at this time. requesting Ortho and/or x ray due to NWB on RLE      Mobility  Bed Mobility Overal bed mobility: Modified Independent             General bed mobility comments: extra time for RLE movement, able to perform with  bedrails  Transfers Overall transfer level: Needs assistance Equipment used: Standard walker Transfers: Sit to/from Stand Sit to Stand: Mod assist         General transfer comment: STS Mod A, unable to weightbear onto RLE due to severe pain (patient rating 9/10) upon attempt at weightbearing.   Ambulation/Gait             General Gait Details: deferred due to pain in R knee, informed nursing for physician followup due to concern.   Stairs            Wheelchair Mobility    Modified Rankin (Stroke Patients Only)       Balance Overall balance assessment: Mild deficits observed, not formally tested(unable due to R knee)                                           Pertinent Vitals/Pain Pain Assessment: 0-10 Pain Score: 9  Pain Location: 9/10 pain in R knee when weightbearing, pain in R knee when extending and flexing knee Pain Descriptors / Indicators: Stabbing Pain Intervention(s): Monitored during session;Repositioned;Other (comment)(informed nursing )    Home Living Family/patient expects to be discharged to:: Private residence Living Arrangements: Spouse/significant other Available Help at Discharge: Family Type of Home: House Home Access: Level entry     Home Layout: One level Home Equipment: Environmental consultant - 2 wheels;Cane - single point;Grab bars - toilet;Grab bars - tub/shower;Toilet riser Additional  Comments: Patient has equipment at home however has not been using it until this previous week.     Prior Function Level of Independence: Independent with assistive device(s)         Comments: Patient reports he works at [employer]. Is independent with most daily tasks, occasionally has been using an AD for mobility this previous week.      Hand Dominance   Dominant Hand: Right    Extremity/Trunk Assessment   Upper Extremity Assessment Upper Extremity Assessment: Overall WFL for tasks assessed    Lower Extremity Assessment Lower  Extremity Assessment: RLE deficits/detail;LLE deficits/detail RLE Deficits / Details: R hip; 4-/5, knee: unable to test due to pain, excessive swelling and redness of knee RLE: Unable to fully assess due to pain RLE Sensation: WNL RLE Coordination: decreased gross motor(due to pain) LLE Deficits / Details: gross 4-/5  LLE Sensation: WNL LLE Coordination: WNL       Communication   Communication: No difficulties  Cognition Arousal/Alertness: Awake/alert Behavior During Therapy: WFL for tasks assessed/performed Overall Cognitive Status: Within Functional Limits for tasks assessed                                 General Comments: A and O x 4. Eager to participate in PT      General Comments General comments (skin integrity, edema, etc.): noticable edema/swelling of R knee, warmth to touch of R knee, tenderness to palpation of R knee especially to lateral distal aspect, unable to fully extend and flex knee    Exercises     Assessment/Plan    PT Assessment Patient needs continued PT services  PT Problem List Decreased strength;Decreased range of motion;Decreased activity tolerance;Decreased mobility;Decreased balance;Decreased knowledge of use of DME;Pain       PT Treatment Interventions DME instruction;Gait training;Functional mobility training;Therapeutic activities;Therapeutic exercise;Balance training;Neuromuscular re-education;Manual techniques;Patient/family education    PT Goals (Current goals can be found in the Care Plan section)  Acute Rehab PT Goals Patient Stated Goal: to return home PT Goal Formulation: With patient Time For Goal Achievement: 06/03/18 Potential to Achieve Goals: Fair    Frequency Min 2X/week   Barriers to discharge Other (comment) will need trial period once physician noted/clear R knee for mobility     Co-evaluation               AM-PAC PT "6 Clicks" Mobility  Outcome Measure Help needed turning from your back to your  side while in a flat bed without using bedrails?: A Little Help needed moving from lying on your back to sitting on the side of a flat bed without using bedrails?: A Little Help needed moving to and from a bed to a chair (including a wheelchair)?: A Lot Help needed standing up from a chair using your arms (e.g., wheelchair or bedside chair)?: A Lot Help needed to walk in hospital room?: A Lot Help needed climbing 3-5 steps with a railing? : A Lot 6 Click Score: 14    End of Session Equipment Utilized During Treatment: Gait belt Activity Tolerance: Patient limited by pain;Other (comment)(inability to weightbear on RLE) Patient left: in bed;with bed alarm set;with call bell/phone within reach Nurse Communication: Mobility status;Weight bearing status;Other (comment)(need for potential x ray due to patients pain, swelling, heat, and inability to weightbear) PT Visit Diagnosis: Unsteadiness on feet (R26.81);Other abnormalities of gait and mobility (R26.89);Muscle weakness (generalized) (M62.81);History of falling (Z91.81);Difficulty in walking, not elsewhere  classified (R26.2);Pain Pain - Right/Left: Right Pain - part of body: Knee    Time: [id number] PT Time Calculation (min) (ACUTE ONLY): 25 min   Charges:   PT Evaluation $PT Eval Low Complexity: 1 Low PT Treatments $Therapeutic Activity: 8-22 mins        Precious BardMarina Lovely Kerins, PT, DPT    Precious BardMarina Huldah Marin 05/20/2018, 3:11 PM

## 2018-05-20 NOTE — Progress Notes (Signed)
[facility] Cardiology  SUBJECTIVE: Laying comfortably in bed, denies chest pain, shortness of breath, palpitations or heart racing   Vitals:   05/20/18 0249 05/20/18 0453 05/20/18 0454 05/20/18 0819  BP: (!) 141/101 (!) 162/105 (!) 156/111 (!) 146/94  Pulse: (!) 111 (!) 118 (!) 107 (!) 120  Resp:  19  20  Temp:  (!) 97.5 F (36.4 C)  97.7 F (36.5 C)  TempSrc:  Oral  Oral  SpO2:  99%  99%  Weight:   67 kg   Height:         Intake/Output Summary (Last 24 hours) at 05/20/2018 0831 Last data filed at 05/20/2018 0454 Gross per 24 hour  Intake 28.22 ml  Output 1000 ml  Net -971.78 ml      PHYSICAL EXAM  General: Well developed, well nourished, in no acute distress HEENT:  Normocephalic and atramatic Neck:  No JVD.  Lungs: Clear bilaterally to auscultation and percussion. Heart: Irregularly irregular. Normal S1 and S2 without gallops or murmurs.  Abdomen: Bowel sounds are positive, abdomen soft and non-tender  Msk:  Back normal, normal gait. Normal strength and tone for age. Extremities: No clubbing, cyanosis or edema.   Neuro: Alert and oriented X 3. Psych:  Good affect, responds appropriately   LABS: Basic Metabolic Panel: Recent Labs    05/17/18 1411 05/18/18 0632  NA 139 138  K 4.2 3.9  CL 102 96*  CO2 26 29  GLUCOSE 129* 131*  BUN 16 18  CREATININE 1.38* 1.20  CALCIUM 9.3 9.2   Liver Function Tests: Recent Labs    05/17/18 1411  AST 20  ALT 20  ALKPHOS 91  BILITOT 0.8  PROT 8.1  ALBUMIN 3.8   No results for input(s): LIPASE, AMYLASE in the last 72 hours. CBC: Recent Labs    05/17/18 1411 05/18/18 0632  WBC 11.1* 9.7  NEUTROABS 8.9*  --   HGB 15.5 15.3  HCT 46.3 46.4  MCV 86.2 86.1  PLT 105* 97*   Cardiac Enzymes: Recent Labs    05/17/18 1824 05/18/18 0021 05/18/18 0632  TROPONINI <0.03 <0.03 <0.03   BNP: Invalid input(s): POCBNP D-Dimer: No results for input(s): DDIMER in the last 72 hours. Hemoglobin A1C: Recent Labs     05/18/18 0632  HGBA1C 6.1*   Fasting Lipid Panel: No results for input(s): CHOL, HDL, LDLCALC, TRIG, CHOLHDL, LDLDIRECT in the last 72 hours. Thyroid Function Tests: No results for input(s): TSH, T4TOTAL, T3FREE, THYROIDAB in the last 72 hours.  Invalid input(s): FREET3 Anemia Panel: No results for input(s): VITAMINB12, FOLATE, FERRITIN, TIBC, IRON, RETICCTPCT in the last 72 hours.  No results found.   Echo LVEF 55 to 60%  TELEMETRY: Atrial fibrillation 120 bpm:  ASSESSMENT AND PLAN:  Active Problems:   Chronic atrial fibrillation with RVR    1.  Chronic atrial fibrillation, Xarelto for stroke prevention, intermittent RVR, asymptomatic, on metoprolol succinate 50 mg daily, Cardizem CD 120 mg daily at home, only on metoprolol succinate 100 mg daily, intermittent RVR requiring diltiazem bolus and drip and intravenous digoxin 2.  Syncope, likely due to atrial fibrillation with RVR 3.  Essential hypertension labile blood pressure  Recommendations  1.  Agree with overall current therapy 2.  Continue Xarelto for stroke prevention 3.  Continue metoprolol succinate 100 mg daily 4.  Start Cardizem 60 mg every 6 hours 5.  Consider adding p.o. digoxin if heart rate remains refractory to metoprolol succinate and Cardizem   Marcina Millard, MD, PhD, [facility]  05/20/2018 8:31 AM

## 2018-05-20 NOTE — Progress Notes (Signed)
[facility] [place] [facility]   PATIENT NAME: Peter HaggardJames Sanchez    MR#:  [id number]  DATE OF BIRTH:  Jul 24, 1934  SUBJECTIVE:  CHIEF COMPLAINT: Patient denies any palpitations or dizzy spells.  Off Cardizem drip .  Heart rate at 120s today  REVIEW OF SYSTEMS:  CONSTITUTIONAL: No fever, fatigue or weakness.  EYES: No blurred or double vision.  EARS, NOSE, AND THROAT: No tinnitus or ear pain.  RESPIRATORY: No cough, shortness of breath, wheezing or hemoptysis.  CARDIOVASCULAR: No chest pain, orthopnea, edema.  GASTROINTESTINAL: No nausea, vomiting, diarrhea or abdominal pain.  GENITOURINARY: No dysuria, hematuria.  ENDOCRINE: No polyuria, nocturia,  HEMATOLOGY: No anemia, easy bruising or bleeding SKIN: No rash or lesion. MUSCULOSKELETAL: No joint pain or arthritis.   NEUROLOGIC: No tingling, numbness, weakness.  PSYCHIATRY: No anxiety or depression.   DRUG ALLERGIES:  No Known Allergies  VITALS:  Blood pressure (!) 146/94, pulse (!) 120, temperature 97.7 F (36.5 C), temperature source Oral, resp. rate 20, height 5\' 8"  (1.727 m), weight 67 kg, SpO2 99 %.  PHYSICAL EXAMINATION:  GENERAL:  84 y.o.-year-old patient lying in the bed with no acute distress.  EYES: Pupils equal, round, reactive to light and accommodation. No scleral icterus. Extraocular muscles intact.  HEENT: Head atraumatic, normocephalic. Oropharynx and nasopharynx clear.  NECK:  Supple, no jugular venous distention. No thyroid enlargement, no tenderness.  LUNGS: Normal breath sounds bilaterally, no wheezing, rales,rhonchi or crepitation. No use of accessory muscles of respiration.  CARDIOVASCULAR: Irregularly irregular no murmurs, rubs, or gallops.  ABDOMEN: Soft, nontender, nondistended. Bowel sounds present.  EXTREMITIES: No pedal edema, cyanosis, or clubbing.  NEUROLOGIC: Awake, alert and oriented x3  Sensation intact. Gait not checked.  PSYCHIATRIC: The patient is alert and  oriented x 3.  SKIN: No obvious rash, lesion, or ulcer.    LABORATORY PANEL:   CBC Recent Labs  Lab 05/18/18 0632  WBC 9.7  HGB 15.3  HCT 46.4  PLT 97*   ------------------------------------------------------------------------------------------------------------------  Chemistries  Recent Labs  Lab 05/17/18 1411 05/18/18 0632  NA 139 138  K 4.2 3.9  CL 102 96*  CO2 26 29  GLUCOSE 129* 131*  BUN 16 18  CREATININE 1.38* 1.20  CALCIUM 9.3 9.2  AST 20  --   ALT 20  --   ALKPHOS 91  --   BILITOT 0.8  --    ------------------------------------------------------------------------------------------------------------------  Cardiac Enzymes Recent Labs  Lab 05/18/18 0632  TROPONINI <0.03   ------------------------------------------------------------------------------------------------------------------  RADIOLOGY:  No results found.  EKG:   Orders placed or performed during the hospital encounter of 05/17/18  . ED EKG  . ED EKG  . EKG 12-Lead  . EKG 12-Lead  . EKG    ASSESSMENT AND PLAN:    atrial fibrillation with RVR -Weaned off diltiazem drip -On Cardizem CD and Toprol-XL, titrate doses as needed -We will add digoxin if heart rate is refractory to above 2 meds -Acute MI ruled out with negative troponins x3 - ECHO 55 to 60% EF.  Cavity size was normal. -Cardiology consult-kc; Dr.Paraschos is following -Cardiac monitoring On Xarelto  Syncope-likely due to above -ECHO-55 to 60% EF -Orthostatics noticed -Cardiac monitoring  Chronic diastolic congestive heart failure- stable, no signs of volume overload -Continue home Lasix with holding parameters  Hypertension-currently hypotensive -On diltiazem drip will wean off as tolerated -Resuming home medication metoprolol and Toprol dose increased 200 mg once daily with holding parameters -Cardizem p.o. home medication on hold  Hyperlipidemia- stable -Continue home statin  Type 2 diabetes- takes  metformin at home - SSI,  his last A1c was 6.1%  Generalized weakness physical therapy consult placed   All the records are reviewed and case discussed with Care Management/Social Workerr. Management plans discussed with the patient, he is  in agreement.  Call placed at [phone] and discussed with her wife Peter Sanchez, given update  CODE STATUS: FC  TOTAL TIME TAKING CARE OF THIS PATIENT: 35 minutes.   POSSIBLE D/C IN 1-2 DAYS, DEPENDING ON CLINICAL CONDITION.  Note: This dictation was prepared with Dragon dictation along with smaller phrase technology. Any transcriptional errors that result from this process are unintentional.   Ramonita Lab M.D on 05/20/2018 at 1:25 PM  Between 7am to 6pm - Pager - [phone] After 6pm go to [email or web address] - password [email or web address]  [facility]  Office  [phone]  CC: Primary care physician; Patient, No Pcp Per

## 2018-05-20 NOTE — Progress Notes (Signed)
Patient HR sustaining 120-130s. Luther Parody, NP notified. Orders placed.   Update: HR still sustaining 120-140 despite receiving medication. Luther Parody, NP notified. Orders placed.   Update: Patient HR fluctuating between 115-130. Luther Parody, NP made aware. Orders placed.   Update: This RN still has not received Cardizem bolus. Oncoming RN notified.

## 2018-05-21 ENCOUNTER — Inpatient Hospital Stay: Payer: Medicare Other

## 2018-05-21 LAB — SYNOVIAL CELL COUNT + DIFF, W/ CRYSTALS
Crystals, Fluid: NONE SEEN
Eosinophils-Synovial: 0 %
Lymphocytes-Synovial Fld: 8 %
Monocyte-Macrophage-Synovial Fluid: 6 %
Neutrophil, Synovial: 86 %
WBC, Synovial: 2337 /mm3 — ABNORMAL HIGH (ref 0–200)

## 2018-05-21 LAB — CBC
HCT: 45.2 % (ref 39.0–52.0)
Hemoglobin: 15 g/dL (ref 13.0–17.0)
MCH: 28.5 pg (ref 26.0–34.0)
MCHC: 33.2 g/dL (ref 30.0–36.0)
MCV: 85.8 fL (ref 80.0–100.0)
Platelets: 109 10*3/uL — ABNORMAL LOW (ref 150–400)
RBC: 5.27 MIL/uL (ref 4.22–5.81)
RDW: 13.8 % (ref 11.5–15.5)
WBC: 9.6 10*3/uL (ref 4.0–10.5)
nRBC: 0 % (ref 0.0–0.2)

## 2018-05-21 LAB — GLUCOSE, CAPILLARY
Glucose-Capillary: 104 mg/dL — ABNORMAL HIGH (ref 70–99)
Glucose-Capillary: 113 mg/dL — ABNORMAL HIGH (ref 70–99)
Glucose-Capillary: 116 mg/dL — ABNORMAL HIGH (ref 70–99)
Glucose-Capillary: 141 mg/dL — ABNORMAL HIGH (ref 70–99)

## 2018-05-21 LAB — CREATININE, SERUM
Creatinine, Ser: 1 mg/dL (ref 0.61–1.24)
GFR calc Af Amer: >60 mL/min (ref >60)
GFR calc non Af Amer: >60 mL/min (ref >60)

## 2018-05-21 MED ORDER — RIVAROXABAN 20 MG PO TABS
20.0000 mg | ORAL_TABLET | Freq: Every day | ORAL | Status: DC
  Administered 2018-05-21 – 2018-05-22 (×2): 20 mg via ORAL
  Filled 2018-05-21 (×2): qty 1

## 2018-05-21 NOTE — Progress Notes (Signed)
[facility] Cardiology  SUBJECTIVE: Patient laying in bed, denies chest pain or shortness of breath   Vitals:   05/20/18 1931 05/21/18 0416 05/21/18 0418 05/21/18 0744  BP: (!) 132/93 131/88  (!) 128/92  Pulse: 78 78  77  Resp: 20 20  19   Temp: 98.4 F (36.9 C) (!) 97.5 F (36.4 C)    TempSrc: Oral Oral    SpO2: 96% 99%  100%  Weight:   75.1 kg   Height:         Intake/Output Summary (Last 24 hours) at 05/21/2018 2671 Last data filed at 05/21/2018 0420 Gross per 24 hour  Intake 720 ml  Output 300 ml  Net 420 ml      PHYSICAL EXAM  General: Well developed, well nourished, in no acute distress HEENT:  Normocephalic and atramatic Neck:  No JVD.  Lungs: Clear bilaterally to auscultation and percussion. Heart: Irregular irregular rhythm. Normal S1 and S2 without gallops or murmurs.  Abdomen: Bowel sounds are positive, abdomen soft and non-tender  Msk:  Back normal, normal gait. Normal strength and tone for age. Extremities: No clubbing, cyanosis or edema.   Neuro: Alert and oriented X 3. Psych:  Good affect, responds appropriately   LABS: Basic Metabolic Panel: Recent Labs    05/21/18 0451  CREATININE 1.00   Liver Function Tests: No results for input(s): AST, ALT, ALKPHOS, BILITOT, PROT, ALBUMIN in the last 72 hours. No results for input(s): LIPASE, AMYLASE in the last 72 hours. CBC: Recent Labs    05/21/18 0451  WBC 9.6  HGB 15.0  HCT 45.2  MCV 85.8  PLT 109*   Cardiac Enzymes: No results for input(s): CKTOTAL, CKMB, CKMBINDEX, TROPONINI in the last 72 hours. BNP: Invalid input(s): POCBNP D-Dimer: No results for input(s): DDIMER in the last 72 hours. Hemoglobin A1C: No results for input(s): HGBA1C in the last 72 hours. Fasting Lipid Panel: No results for input(s): CHOL, HDL, LDLCALC, TRIG, CHOLHDL, LDLDIRECT in the last 72 hours. Thyroid Function Tests: No results for input(s): TSH, T4TOTAL, T3FREE, THYROIDAB in the last 72 hours.  Invalid input(s):  FREET3 Anemia Panel: No results for input(s): VITAMINB12, FOLATE, FERRITIN, TIBC, IRON, RETICCTPCT in the last 72 hours.  Dg Knee Complete 4 Views Right  Result Date: 05/20/2018 CLINICAL DATA:  Knee pain with swelling EXAM: RIGHT KNEE - COMPLETE 4+ VIEW COMPARISON:  None. FINDINGS: No dislocation. 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 Moderate to large knee effusion with questionable fat fluid level. Subtle irregularity lateral tibial plateau. Vascular calcification. IMPRESSION: 1. Moderate to large knee effusion with possible fat fluid level and findings questionable for subtle fracture at the lateral tibial plateau. Suggest CT of the knee for further evaluation. Electronically Signed   By: Jasmine Pang M.D.   On: 05/20/2018 17:51     Echo LVEF 55 to 60%  TELEMETRY: Atrial fibrillation at 90 bpm:  ASSESSMENT AND PLAN:  Active Problems:   Chronic atrial fibrillation with RVR    1.  Chronic atrial fibrillation, on Xarelto for stroke prevention, intermittent RVR, appears stable on current dose of metoprolol succinate 100 mg daily, and Cardizem CD 300 mg daily 2.  Syncope, without recurrence, likely due to atrial fibrillation with RVR 3.  Essential hypertension  Recommendations  1.  Agree with current therapy 2.  Continue Xarelto for stroke prevention 3.  Continue metoprolol succinate and Cardizem CD at current dose 4.  May discharge home later today 5.  Follow-up with Dr. Gwen Pounds  Sign  off for now, please call if any questions   Marcina MillardAlexander Angelie Kram, MD, PhD, [facility] 05/21/2018 9:23 AM

## 2018-05-21 NOTE — Progress Notes (Signed)
Anticoagulation monitoring(Xarelto):  83yo  Male ordered rivaroxaban 15 mg Qsupper  Filed Weights   05/19/18 0326 05/20/18 0454 05/21/18 0418  Weight: 168 lb 8 oz (76.4 kg) 147 lb 11.2 oz (67 kg) 165 lb 8 oz (75.1 kg)   Body mass index is 25.16 kg/m.   Lab Results  Component Value Date   CREATININE 1.00 05/21/2018   CREATININE 1.20 05/18/2018   CREATININE 1.38 (H) 05/17/2018   Estimated Creatinine Clearance: 56.1 mL/min (by C-G formula based on SCr of 1 mg/dL). Hemoglobin & Hematocrit     Component Value Date/Time   HGB 15.0 05/21/2018 0451   HCT 45.2 05/21/2018 0451   TBW CrCl 61 ml/min  Per Protocol for Patient with estCrcl > 50 ml/min, will transition to rivaroxaban 20 mg Qsupper (pt's home dose).

## 2018-05-21 NOTE — Progress Notes (Signed)
[facility] [place] [facility]   PATIENT NAME: Peter Sanchez    MR#:  [id number]  DATE OF BIRTH:  1934/08/12  SUBJECTIVE:  CHIEF COMPLAINT: Patient denies any palpitations or dizzy spells.  Off Cardizem drip .  Heart rate better.  Patient is reporting that he could not stand on the right knee from significant swelling denies any trauma or fall  REVIEW OF SYSTEMS:  CONSTITUTIONAL: No fever, fatigue or weakness.  EYES: No blurred or double vision.  EARS, NOSE, AND THROAT: No tinnitus or ear pain.  RESPIRATORY: No cough, shortness of breath, wheezing or hemoptysis.  CARDIOVASCULAR: No chest pain, orthopnea, edema.  GASTROINTESTINAL: No nausea, vomiting, diarrhea or abdominal pain.  GENITOURINARY: No dysuria, hematuria.  ENDOCRINE: No polyuria, nocturia,  HEMATOLOGY: No anemia, easy bruising or bleeding SKIN: No rash or lesion. MUSCULOSKELETAL: No joint pain or arthritis.   NEUROLOGIC: No tingling, numbness, weakness.  PSYCHIATRY: No anxiety or depression.   DRUG ALLERGIES:  No Known Allergies  VITALS:  Blood pressure (!) 128/92, pulse 77, temperature (!) 97.5 F (36.4 C), temperature source Oral, resp. rate 19, height 5\' 8"  (1.727 m), weight 75.1 kg, SpO2 100 %.  PHYSICAL EXAMINATION:  GENERAL:  84 y.o.-year-old patient lying in the bed with no acute distress.  EYES: Pupils equal, round, reactive to light and accommodation. No scleral icterus. Extraocular muscles intact.  HEENT: Head atraumatic, normocephalic. Oropharynx and nasopharynx clear.  NECK:  Supple, no jugular venous distention. No thyroid enlargement, no tenderness.  LUNGS: Normal breath sounds bilaterally, no wheezing, rales,rhonchi or crepitation. No use of accessory muscles of respiration.  CARDIOVASCULAR: Irregularly irregular no murmurs, rubs, or gallops.  ABDOMEN: Soft, nontender, nondistended. Bowel sounds present.  EXTREMITIES: No pedal edema, cyanosis, or clubbing.  NEUROLOGIC:  Awake, alert and oriented x3  Sensation intact. Gait not checked.  PSYCHIATRIC: The patient is alert and oriented x 3.  SKIN: No obvious rash, lesion, or ulcer.    LABORATORY PANEL:   CBC Recent Labs  Lab 05/21/18 0451  WBC 9.6  HGB 15.0  HCT 45.2  PLT 109*   ------------------------------------------------------------------------------------------------------------------  Chemistries  Recent Labs  Lab 05/17/18 1411 05/18/18 0632 05/21/18 0451  NA 139 138  --   K 4.2 3.9  --   CL 102 96*  --   CO2 26 29  --   GLUCOSE 129* 131*  --   BUN 16 18  --   CREATININE 1.38* 1.20 1.00  CALCIUM 9.3 9.2  --   AST 20  --   --   ALT 20  --   --   ALKPHOS 91  --   --   BILITOT 0.8  --   --    ------------------------------------------------------------------------------------------------------------------  Cardiac Enzymes Recent Labs  Lab 05/18/18 [id number]  TROPONINI <0.03   ------------------------------------------------------------------------------------------------------------------  RADIOLOGY:  Dg Knee Complete 4 Views Right  Result Date: 05/20/2018 CLINICAL DATA:  Knee pain with swelling EXAM: RIGHT KNEE - COMPLETE 4+ VIEW COMPARISON:  None. FINDINGS: No dislocation. 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 Moderate to large knee effusion with questionable fat fluid level. Subtle irregularity lateral tibial plateau. Vascular calcification. IMPRESSION: 1. Moderate to large knee effusion with possible fat fluid level and findings questionable for subtle fracture at the lateral tibial plateau. Suggest CT of the knee for further evaluation. Electronically Signed   By: Jasmine PangKim  Fujinaga M.D.   On: 05/20/2018 17:51    EKG:   Orders placed or performed during the  hospital encounter of 05/17/18  . ED EKG  . ED EKG  . EKG 12-Lead  . EKG 12-Lead  . EKG    ASSESSMENT AND PLAN:    Acute right knee effusion moderate to severe CT of the  knee Consult placed to orthopedics PT evaluation  atrial fibrillation with RVR -Weaned off diltiazem drip -On Cardizem CD and Toprol-XL, titrate doses as needed -We will add digoxin if heart rate is refractory to above 2 meds -Acute MI ruled out with negative troponins x3 - ECHO 55 to 60% EF.  Cavity size was normal. -Cardiology consult-kc; Dr.Paraschos is following -Cardiac monitoring On Xarelto  Syncope-likely due to above -ECHO-55 to 60% EF -Orthostatics noticed -Cardiac monitoring  Chronic diastolic congestive heart failure- stable, no signs of volume overload -Continue home Lasix with holding parameters  Hypertension-currently hypotensive -On diltiazem drip will wean off as tolerated -Resuming home medication metoprolol and Toprol dose increased 200 mg once daily with holding parameters -Cardizem p.o. home medication on hold  Hyperlipidemia- stable -Continue home statin  Type 2 diabetes- takes metformin at home - SSI,  his last A1c was 6.1%  Generalized weakness physical therapy consult placed-recommending home health PT   All the records are reviewed and case discussed with Care Management/Social Workerr. Management plans discussed with the patient, he is  in agreement.  Call placed at [phone] and discussed with her wife Kara Mead, given update  CODE STATUS: FC  TOTAL TIME TAKING CARE OF THIS PATIENT: 35 minutes.   POSSIBLE D/C IN 1-2 DAYS, DEPENDING ON CLINICAL CONDITION.  Note: This dictation was prepared with Dragon dictation along with smaller phrase technology. Any transcriptional errors that result from this process are unintentional.   Ramonita Lab M.D on 05/21/2018 at 1:26 PM  Between 7am to 6pm - Pager - [phone] After 6pm go to [email or web address] - password [email or web address]  [facility]  Office  [phone]  CC: Primary care physician; Patient, No Pcp Per

## 2018-05-21 NOTE — Progress Notes (Signed)
PT Cancellation Note  Patient Details Name: Peter Sanchez MRN: [id number] DOB: Jun 12, 1934   Cancelled Treatment:    Reason Eval/Treat Not Completed: Other (comment)(waiting for CT scan to clear knee ) Waiting for clearance from imaging to see patient. Will return at later time/date.   Precious Bard, PT, DPT   05/21/2018, 1:07 PM

## 2018-05-21 NOTE — Consult Note (Signed)
ORTHOPAEDIC CONSULTATION  REQUESTING PHYSICIAN: Ramonita Lab, MD  Chief Complaint:   Right knee pain and swelling.  History of Present Illness: Peter Sanchez is a 84 y.o. male with a history of hypertension, coronary artery disease, and chronic atrial fibrillation for which he has been anticoagulated who lives independently.  Apparently, the patient was in his usual state of health last Wednesday and recalls sitting in his chair.  When he went to get up, he believes he lost consciousness and fell to the ground.  The next thing he remembers is being in the EMS vehicle and being brought to the emergency room.  The patient subsequently was admitted for work-up of his rapid heart rate and syncopal episode.  The patient began to complain of right knee pain yesterday when he could not tolerate physical therapy, so x-rays were obtained and orthopedic consultation was requested.  The patient denies any prior problems with his right knee, and has not had any prior surgery on his right knee.  He denies any history of gout, and has remained afebrile throughout his hospital stay.  Past Medical History:  Diagnosis Date  . Hypertension    History reviewed. No pertinent surgical history. Social History   Socioeconomic History  . Marital status: Married    Spouse name: Not on file  . Number of children: Not on file  . Years of education: Not on file  . Highest education level: Not on file  Occupational History  . Not on file  Social Needs  . Financial resource strain: Not on file  . Food insecurity:    Worry: Not on file    Inability: Not on file  . Transportation needs:    Medical: Not on file    Non-medical: Not on file  Tobacco Use  . Smoking status: Never Smoker  . Smokeless tobacco: Never Used  Substance and Sexual Activity  . Alcohol use: Never    Frequency: Never  . Drug use: Never  . Sexual activity: Not on file   Lifestyle  . Physical activity:    Days per week: Not on file    Minutes per session: Not on file  . Stress: Not on file  Relationships  . Social connections:    Talks on phone: Not on file    Gets together: Not on file    Attends religious service: Not on file    Active member of club or organization: Not on file    Attends meetings of clubs or organizations: Not on file    Relationship status: Not on file  Other Topics Concern  . Not on file  Social History Narrative  . Not on file   History reviewed. No pertinent family history. No Known Allergies Prior to Admission medications   Medication Sig Start Date End Date Taking? Authorizing Provider  diclofenac sodium (VOLTAREN) 1 % GEL Apply 4 g topically 4 (four) times daily. 05/17/18 05/17/19 Yes [provider]  diltiazem (CARDIZEM CD) 180 MG 24 hr capsule Take 180 mg by mouth daily. 10/05/17  Yes [provider]  empagliflozin (JARDIANCE) 10 MG TABS tablet Take 10 mg by mouth daily.   Yes [provider]  furosemide (LASIX) 20 MG tablet Take 20 mg by mouth daily.   Yes [provider]  metFORMIN (GLUCOPHAGE) 500 MG tablet Take 500 mg by mouth daily. 06/22/16  Yes [provider]  metoprolol succinate (TOPROL-XL) 50 MG 24 hr tablet Take 50 mg by mouth daily. 11/23/17  Yes  [provider]  pravastatin (PRAVACHOL) 20 MG tablet Take 20 mg by mouth daily. 11/24/17  Yes [provider]  rivaroxaban (XARELTO) 20 MG TABS tablet TAKE 1 TABLET BY MOUTH EVERY DAY WITH DINNER 04/12/18  Yes [provider]  lisinopril (ZESTRIL) 10 MG tablet Take 10 mg by mouth daily. 05/04/18   [provider]   Ct Knee Right Wo Contrast  Result Date: 05/21/2018 CLINICAL DATA:  Persistent right knee pain and buckling since falling last week. No previous relevant surgery. EXAM: CT OF THE RIGHT KNEE WITHOUT CONTRAST TECHNIQUE: Multidetector CT imaging of the right knee was performed  according to the standard protocol. Multiplanar CT image reconstructions were also generated. COMPARISON:  Radiographs 05/20/2018. FINDINGS: Bones/Joint/Cartilage There is moderately advanced tricompartmental osteoarthritis with joint space narrowing and osteophyte formation, most advanced in the medial compartment. There is a nondisplaced fracture involving the posterior aspect of the medial tibial plateau, most obvious on axial images 51 through 55 of series 2. This is not depressed. The lateral tibial plateau appears intact. The distal femur, patella and proximal fibula are intact. There is a large complex knee joint effusion with fluid-fluid levels consistent with hemarthrosis. No definite layering intra-articular fat. No significant Baker's cyst. Ligaments Suboptimally assessed by CT. There is irregular soft tissue thickening in the intercondylar notch suspicious for synovitis. Meniscal chondrocalcinosis noted. Muscles and Tendons Intact extensor mechanism.  No focal muscular abnormalities. Soft tissues Mild periarticular soft tissue swelling without focal hematoma. Moderate femoral popliteal atherosclerosis. IMPRESSION: 1. Nondisplaced fracture of the medial tibial plateau posteriorly. 2. No other acute osseous findings. 3. Moderately advanced tricompartmental osteoarthritis, greatest in the medial compartment. Associated meniscal chondrocalcinosis and probable synovitis in the intercondylar notch. 4. Large hemarthrosis without definite layering intra-articular fat. Electronically Signed   By: Carey BullocksWilliam  Veazey M.D.   On: 05/21/2018 14:36   Dg Knee Complete 4 Views Right  Result Date: 05/20/2018 CLINICAL DATA:  Knee pain with swelling EXAM: RIGHT KNEE - COMPLETE 4+ VIEW COMPARISON:  None. FINDINGS: No dislocation. 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 Moderate to large knee effusion with questionable fat fluid level. Subtle irregularity lateral tibial plateau.  Vascular calcification. IMPRESSION: 1. Moderate to large knee effusion with possible fat fluid level and findings questionable for subtle fracture at the lateral tibial plateau. Suggest CT of the knee for further evaluation. Electronically Signed   By: Jasmine PangKim  Fujinaga M.D.   On: 05/20/2018 17:51    Positive ROS: All other systems have been reviewed and were otherwise negative with the exception of those mentioned in the HPI and as above.  Physical Exam: General:  Alert, no acute distress Psychiatric:  Patient is competent for consent with normal mood and affect   Cardiovascular:  No pedal edema Respiratory:  No wheezing, non-labored breathing GI:  Abdomen is soft and non-tender Skin:  No lesions in the area of chief complaint Neurologic:  Sensation intact distally Lymphatic:  No axillary or cervical lymphadenopathy  Orthopedic Exam:  Orthopedic examination is limited to the right knee and lower extremity.  The right knee demonstrates a 2+ effusion but only mild swelling around the knee.  No erythema, ecchymosis, abrasions, or other skin abnormalities are identified.  He notes mild-moderate focal tenderness to palpation along the medial and posterior medial aspect of the knee, but there is no tenderness along the lateral aspect of the knee, nor in the peripatellar region.  He is able to perform an active straight leg raise but has pain with  attempted knee range of motion.  The knee is stable to gentle varus and valgus stressing.  He also does not appear to have a positive Lachman's test.  He can tolerate motion only from 20 to 35 degrees of flexion.  He is neurovascularly intact to the right lower extremity and foot.  X-rays:  Recent x-rays of the right knee are available for review and have been reviewed by myself.  These films demonstrate moderate tricompartmental degenerative changes, most pronounced in the medial compartment.  There is a significant knee effusion with a possible blood fat layer.   No obvious fractures or lytic lesions are identified.  A CT scan of the knee was performed earlier today.  This study also has been reviewed by myself.  This study demonstrates the presence of a nondisplaced fracture of the posterior portion of the medial tibial plateau without depression or separation.  Again, tricompartmental degenerative changes are noted, as is a large effusion with fluid-fluid levels consistent with a hemarthrosis.  Assessment: Large right knee hemarthrosis secondary to nondisplaced right medial tibial plateau fracture.  Plan: The treatment options were discussed with the patient.  Based on the CT scan, this fracture pattern can be managed nonsurgically.  He is to be placed into a knee immobilizer for comfort.  He may be mobilized with physical therapy, but is to remain nonweightbearing on the right lower extremity at this time.    The patient also was offered and accepts an aspiration of the right knee to see if this might help alleviate both the pressure on the knee as well as some of the pain in his knee.  After obtaining verbal consent, this procedure was performed sterilely.  Approximately 60-65 cc of bloody fluid was aspirated from the knee.  The patient tolerated the procedure well.  Thank you for asking me to participate in the care of this most pleasant yet unfortunate man.  I will be happy to follow him with you.   Maryagnes Amos, MD  Beeper #:  [phone]  05/21/2018 6:16 PM

## 2018-05-22 LAB — GLUCOSE, CAPILLARY
Glucose-Capillary: 117 mg/dL — ABNORMAL HIGH (ref 70–99)
Glucose-Capillary: 119 mg/dL — ABNORMAL HIGH (ref 70–99)
Glucose-Capillary: 94 mg/dL (ref 70–99)
Glucose-Capillary: 89 mg/dL (ref 70–99)
Glucose-Capillary: 110 mg/dL — ABNORMAL HIGH (ref 70–99)

## 2018-05-22 MED ORDER — TRAMADOL HCL 50 MG PO TABS: 50.0000 mg | ORAL_TABLET | Freq: Three times a day (TID) | ORAL | Status: DC | PRN

## 2018-05-22 NOTE — Evaluation (Signed)
Physical Therapy Re-Evaluation Patient Details Name: GERMAINE RAMCHANDANI MRN: [id number] DOB: [birth year] Today's Date: 05/22/2018   History of Present Illness  Patient is a pleasant 84 year old male who was found to have fallen/passed out at home. Reports pain in R knee since fall. Per EMS patient was found to be in A fib.  Upon entering ED patient was found to be in A fib with RVR and HR in low 100s. Patient has PMH of CAI, HTN, HLD, chronic diastolic HF. Reports he hasn't been able to move R knee since he fell on Wednesday. Imaging now showing fracture, patient is to be nonweightbearing with knee immobilizer.   Clinical Impression  Patient is a pleasant 84 year old male who is being re-evaluated due to changing weightbearing status. Patient is now NWB of RLE due to imaging results indicating fracture. Use of a right knee immobilizer required with mobility. Patient was educated on new weightbearing status, reason for status and safe mobility techniques. Patient verbalized understanding however upon attempt of sit to stand and stand pivot transferring patient did not demonstrate understanding,requiring max cueing for not utilizing RLE and PT assisting in keeping RLE off ground. Patient did not demonstrate safe transfer ability, now requiring Mod A and limited ability to perform. Due to patient's new limitations his POC has changed, he requires more frequent skilled physical therapy while hospitalized. Upon discharge patient will now benefit from placement at SNF due to limited mobility and not being safe for home due to high fall risk and nonweightbearing status.     Follow Up Recommendations SNF( )    Equipment Recommendations  3in1 (PT)    Recommendations for Other Services       Precautions / Restrictions Precautions Precautions: Fall Restrictions Weight Bearing Restrictions: Yes RLE Weight Bearing: Non weight bearing      Mobility  Bed Mobility Overal bed mobility: Needs Assistance Bed  Mobility: Supine to Sit     Supine to sit: Min assist     General bed mobility comments: Min A with RLE movement off of bed due to use of immobilizer.   Transfers Overall transfer level: Needs assistance Equipment used: Standard walker Transfers: Sit to/from [employer] Transfers Sit to Stand: Mod assist;From elevated surface Stand pivot transfers: Mod assist;From elevated surface       General transfer comment: Max verbal cueing and sequencing for non weightbearing status of RLE, Requires PT to hold RLE to stop WB attempts   Ambulation/Gait             General Gait Details: not able to perform   Stairs            Wheelchair Mobility    Modified Rankin (Stroke Patients Only)       Balance Overall balance assessment: Mild deficits observed, not formally tested;Needs assistance(unable due to R knee) Sitting-balance support: (L foot support) Sitting balance-Leahy Scale: Good Sitting balance - Comments: reach for walker without LOB seated   Standing balance support: Bilateral upper extremity supported Standing balance-Leahy Scale: Poor Standing balance comment: patient challenged with maintaining NWB of RLE, required Mod A for stability                              Pertinent Vitals/Pain Pain Assessment: 0-10 Pain Score: 6  Pain Location: pain in R knee with attempt at transfers Pain Descriptors / Indicators: Stabbing Pain Intervention(s): Monitored during session;Repositioned;Other (comment)(education on nonweightbearing status)  Home Living Family/patient expects to be discharged to:: Private residence Living Arrangements: Spouse/significant other Available Help at Discharge: Family Type of Home: House Home Access: Level entry     Home Layout: One level Home Equipment: Environmental consultant - 2 wheels;Cane - single point;Grab bars - toilet;Grab bars - tub/shower;Toilet riser Additional Comments: Patient has equipment at home however has not been  using it until this previous week.     Prior Function Level of Independence: Independent with assistive device(s)         Comments: Patient reports he works at [employer]. Is independent with most daily tasks, occasionally has been using an AD for mobility this previous week.      Hand Dominance   Dominant Hand: Right    Extremity/Trunk Assessment        Lower Extremity Assessment RLE Deficits / Details: R hip; 4-/5, knee: unable to test due to pain, excessive swelling and redness of knee(R knee immobilizer now) RLE: Unable to fully assess due to pain RLE Sensation: WNL RLE Coordination: decreased gross motor(due to pain) LLE Deficits / Details: gross 4-/5  LLE Sensation: WNL LLE Coordination: WNL       Communication   Communication: No difficulties  Cognition Arousal/Alertness: Awake/alert Behavior During Therapy: WFL for tasks assessed/performed Overall Cognitive Status: Within Functional Limits for tasks assessed                                 General Comments: A and O x 4. Eager to participate in PT, difficulty understanding/performing NWB status      General Comments General comments (skin integrity, edema, etc.): RLE knee immobilizer    Exercises Other Exercises Other Exercises: patient educated on nonweightbearing status, reason for it, how to perform safe mobility, and use of knee immobilizer for safety.  Other Exercises: sit to stand transfer from raised surface x 4 attempts with max cueing and sequencing and Mod A for stability to maintain NWB status, required cueing for hand placement and lifting RLE.    Assessment/Plan    PT Assessment Patient needs continued PT services  PT Problem List Decreased strength;Decreased range of motion;Decreased activity tolerance;Decreased mobility;Decreased balance;Decreased knowledge of use of DME;Pain;Decreased safety awareness       PT Treatment Interventions DME instruction;Gait  training;Functional mobility training;Therapeutic activities;Therapeutic exercise;Balance training;Neuromuscular re-education;Manual techniques;Patient/family education    PT Goals (Current goals can be found in the Care Plan section)  Acute Rehab PT Goals Patient Stated Goal: to be able to walk again  PT Goal Formulation: With patient Time For Goal Achievement: 06/05/18 Potential to Achieve Goals: Fair    Frequency 7X/week   Barriers to discharge Inaccessible home environment;Decreased caregiver support not safe for trasnfers with NWB status     Co-evaluation               AM-PAC PT "6 Clicks" Mobility  Outcome Measure Help needed turning from your back to your side while in a flat bed without using bedrails?: A Little Help needed moving from lying on your back to sitting on the side of a flat bed without using bedrails?: A Little Help needed moving to and from a bed to a chair (including a wheelchair)?: A Lot Help needed standing up from a chair using your arms (e.g., wheelchair or bedside chair)?: A Lot Help needed to walk in hospital room?: Total Help needed climbing 3-5 steps with a railing? : Total 6 Click Score: 12  End of Session Equipment Utilized During Treatment: Gait belt;Right knee immobilizer Activity Tolerance: Patient limited by pain;Other (comment)(challenge with NWB of RLE) Patient left: in chair;with call bell/phone within reach;with chair alarm set(R knee immobilizer on ) Nurse Communication: Mobility status;Weight bearing status;Other (comment)(patient in chair, limited ability to perform NWB transfers, need for change of POC ) PT Visit Diagnosis: Unsteadiness on feet (R26.81);Other abnormalities of gait and mobility (R26.89);Muscle weakness (generalized) (M62.81);History of falling (Z91.81);Difficulty in walking, not elsewhere classified (R26.2);Pain Pain - Right/Left: Right Pain - part of body: Knee    Time: [id number] PT Time Calculation (min)  (ACUTE ONLY): 20 min   Charges:   PT Evaluation $PT Re-evaluation: 1 Re-eval PT Treatments $Therapeutic Activity: 8-22 mins        Precious BardMarina Dorrian Doggett, PT, DPT    Precious BardMarina Raney [place] 05/22/2018, 2:02 PM

## 2018-05-22 NOTE — Progress Notes (Signed)
Subjective: The patient states that his knee feels better this morning after the aspiration yesterday.  Objective: Vital signs in last 24 hours: Temp:  [98 F (36.7 C)-98.9 F (37.2 C)] 98.6 F (37 C) (04/27 0723) Pulse Rate:  [80-106] 106 (04/27 0723) Resp:  [19] 19 (04/26 1646) BP: (109-138)/(73-96) 127/93 (04/27 0723) SpO2:  [97 %-98 %] 98 % (04/27 0723) Weight:  [74.9 kg] 74.9 kg (04/27 0405)  Intake/Output from previous day: 04/26 0701 - 04/27 0700 In: -  Out: 1425 [Urine:1425] Intake/Output this shift: No intake/output data recorded.  Recent Labs    05/21/18 0451  HGB 15.0   Recent Labs    05/21/18 0451  WBC 9.6  RBC 5.27  HCT 45.2  PLT 109*   Recent Labs    05/21/18 0451  CREATININE 1.00   No results for input(s): LABPT, INR in the last 72 hours.  Physical Exam: Physical examination findings are notable for a smaller effusion this morning, as expected. Skin inspection remains unremarkable, other than the mild swelling around the knee. He again is able to perform a straight leg raise and is neurovascularly intact to the right lower extremity and foot.  Assessment: Knee pain and effusion secondary to nondisplaced posterior medial tibial plateau fracture.  Plan: The treatment options have been reviewed to the patient. He may be mobilized with physical therapy in his knee immobilizer, nonweightbearing on the right lower extremity and foot.  The knee immobilizer may be removed for gentle range of motion exercises.   Peter Sanchez 05/22/2018, 8:03 AM

## 2018-05-22 NOTE — Progress Notes (Signed)
[facility] [place] [facility]   PATIENT NAME: Remi HaggardJames Taglieri    MR#:  [id number]  DATE OF BIRTH:  02-Aug-1934  SUBJECTIVE:  patient feels a lot better after right knee was aspirated.  He has a right knee immobilizer REVIEW OF SYSTEMS:  CONSTITUTIONAL: No fever, fatigue or weakness.  EYES: No blurred or double vision.  EARS, NOSE, AND THROAT: No tinnitus or ear pain.  RESPIRATORY: No cough, shortness of breath, wheezing or hemoptysis.  CARDIOVASCULAR: No chest pain, orthopnea, edema.  GASTROINTESTINAL: No nausea, vomiting, diarrhea or abdominal pain.  GENITOURINARY: No dysuria, hematuria.  ENDOCRINE: No polyuria, nocturia,  HEMATOLOGY: No anemia, easy bruising or bleeding SKIN: No rash or lesion. MUSCULOSKELETAL: No joint pain or arthritis.   NEUROLOGIC: No tingling, numbness, weakness.  PSYCHIATRY: No anxiety or depression.   DRUG ALLERGIES:  No Known Allergies  VITALS:  Blood pressure (!) 127/93, pulse (!) 106, temperature 98.6 F (37 C), temperature source Oral, resp. rate 19, height 5\' 8"  (1.727 m), weight 74.9 kg, SpO2 98 %.  PHYSICAL EXAMINATION:  GENERAL:  84 y.o.-year-old patient lying in the bed with no acute distress.  EYES: Pupils equal, round, reactive to light and accommodation. No scleral icterus. Extraocular muscles intact.  HEENT: Head atraumatic, normocephalic. Oropharynx and nasopharynx clear.  NECK:  Supple, no jugular venous distention. No thyroid enlargement, no tenderness.  LUNGS: Normal breath sounds bilaterally, no wheezing, rales,rhonchi or crepitation. No use of accessory muscles of respiration.  CARDIOVASCULAR: Irregularly irregular no murmurs, rubs, or gallops.  ABDOMEN: Soft, nontender, nondistended. Bowel sounds present.  EXTREMITIES: No pedal edema, cyanosis, or clubbing. Right knee immobilizer NEUROLOGIC: Awake, alert and oriented x3  Sensation intact. Gait not checked.  PSYCHIATRIC: The patient is alert and  oriented x 3.  SKIN: No obvious rash, lesion, or ulcer.    LABORATORY PANEL:   CBC Recent Labs  Lab 05/21/18 0451  WBC 9.6  HGB 15.0  HCT 45.2  PLT 109*   ------------------------------------------------------------------------------------------------------------------  Chemistries  Recent Labs  Lab 05/17/18 1411 05/18/18 0632 05/21/18 0451  NA 139 138  --   K 4.2 3.9  --   CL 102 96*  --   CO2 26 29  --   GLUCOSE 129* 131*  --   BUN 16 18  --   CREATININE 1.38* 1.20 1.00  CALCIUM 9.3 9.2  --   AST 20  --   --   ALT 20  --   --   ALKPHOS 91  --   --   BILITOT 0.8  --   --    ------------------------------------------------------------------------------------------------------------------  Cardiac Enzymes Recent Labs  Lab 05/18/18 [id number]  TROPONINI <0.03   ------------------------------------------------------------------------------------------------------------------  RADIOLOGY:  Ct Knee Right Wo Contrast  Result Date: 05/21/2018 CLINICAL DATA:  Persistent right knee pain and buckling since falling last week. No previous relevant surgery. EXAM: CT OF THE RIGHT KNEE WITHOUT CONTRAST TECHNIQUE: Multidetector CT imaging of the right knee was performed according to the standard protocol. Multiplanar CT image reconstructions were also generated. COMPARISON:  Radiographs 05/20/2018. FINDINGS: Bones/Joint/Cartilage There is moderately advanced tricompartmental osteoarthritis with joint space narrowing and osteophyte formation, most advanced in the medial compartment. There is a nondisplaced fracture involving the posterior aspect of the medial tibial plateau, most obvious on axial images 51 through 55 of series 2. This is not depressed. The lateral tibial plateau appears intact. The distal femur, patella and proximal fibula are intact. There is a large complex knee joint effusion  with fluid-fluid levels consistent with hemarthrosis. No definite layering intra-articular fat.  No significant Baker's cyst. Ligaments Suboptimally assessed by CT. There is irregular soft tissue thickening in the intercondylar notch suspicious for synovitis. Meniscal chondrocalcinosis noted. Muscles and Tendons Intact extensor mechanism.  No focal muscular abnormalities. Soft tissues Mild periarticular soft tissue swelling without focal hematoma. Moderate femoral popliteal atherosclerosis. IMPRESSION: 1. Nondisplaced fracture of the medial tibial plateau posteriorly. 2. No other acute osseous findings. 3. Moderately advanced tricompartmental osteoarthritis, greatest in the medial compartment. Associated meniscal chondrocalcinosis and probable synovitis in the intercondylar notch. 4. Large hemarthrosis without definite layering intra-articular fat. Electronically Signed   By: Carey Bullocks M.D.   On: 05/21/2018 14:36    EKG:   Orders placed or performed during the hospital encounter of 05/17/18  . ED EKG  . ED EKG  . EKG 12-Lead  . EKG 12-Lead  . EKG    ASSESSMENT AND PLAN:    *Acute right knee effusion moderate to severe -CT of the knee-- showed large right knee effusion suspected heme arthrosis and tibial medial collateral nondisplaced fracture -Dr poggi's input appreciated--s/p right knee joint aspiration--bloody tap -PT evaluation noted--rehab since pt is NWB on right leg  * acute on chronic atrial fibrillation with RVR -Weaned off diltiazem drip -On Cardizem CD and Toprol-XL, titrate doses as needed -Acute MI ruled out with negative troponins x3 - ECHO 55 to 60% EF.  Cavity size was normal. -Cardiology consult-kc; Dr.Paraschos is following -Cardiac monitoring -On Xarelto  *Syncope-likely due to above -ECHO-55 to 60% EF -Orthostatics noticed -Cardiac monitoring  *Chronic diastolic congestive heart failure- stable, no signs of volume overload -Continue home Lasix with holding parameters  *Hypertension-currently hypotensive -On diltiazem drip will wean off as  tolerated -Resuming home medication metoprolol and Toprol dose increased 200 mg once daily with holding parameters -Cardizem p.o. home medication on hold  *Hyperlipidemia- stable -Continue home statin  *Type 2 diabetes- takes metformin at home - SSI,  his last A1c was 6.1%  Patient will discharged to rehab once bed available. COVID-19 test was recommended and has been sent out since patient is going to rehab which is requirement from the rehab facility.   CODE STATUS: FC  TOTAL TIME TAKING CARE OF THIS PATIENT: 25 minutes.   POSSIBLE D/C IN 1-2 DAYS, DEPENDING ON CLINICAL CONDITION.  Note: This dictation was prepared with Dragon dictation along with smaller phrase technology. Any transcriptional errors that result from this process are unintentional.   Enedina Finner M.D on 05/22/2018 at 3:05 PM  Between 7am to 6pm - Pager - [phone] After 6pm go to [email or web address] - password [email or web address]  [facility]  Office  [phone]  CC: Primary care physician; Patient, No Pcp Per

## 2018-05-22 NOTE — Care Management Important Message (Signed)
Important Message  Patient Details  Name: MERDITH LUCIBELLO MRN: [id number] Date of Birth: 1934-02-28   Medicare Important Message Given:  Yes    Johnell Comings 05/22/2018, 11:20 AM

## 2018-05-22 NOTE — Progress Notes (Signed)
PT Cancellation Note  Patient Details Name: Peter Sanchez MRN: [id number] DOB: [birth year]   Cancelled Treatment:    Reason Eval/Treat Not Completed: Other (comment)(patient eating lunch, requests return after finished) Will return at later time/date to assess new mobility status now that patient is NWB.   Precious Bard, PT, DPT   05/22/2018, 1:04 PM

## 2018-05-22 NOTE — Progress Notes (Deleted)
Pt complains of 10/10 generalized pain. MD notified. Orders for ultram 50 received. I will continue to assess.           

## 2018-05-22 NOTE — TOC Initial Note (Signed)
Transition of Care [facility](TOC) - Initial/Assessment Note    Patient Details  Name: Peter Sanchez MRN: [id number] Date of Birth: Nov 29, 1934  Transition of Care [facility](TOC) CM/SW Contact:    Darleene CleaverAnterhaus, Lilianne Delair R, LCSW Phone Number: 05/22/2018, 5:29 PM  Clinical Narrative:                  Patient is an 84 year old male who lives alone.  Patient is alert and oriented x4 and lives alone.  Patient states he has never been to rehab before, CSW explained to him what to expect and how insurance will pay for the stay.  Patient is now agreeable to going to SNF for short term rehab.  Patient was explained what the process is for going to SNF and looking for facilities.  Patient expressed that he is hopeful that he will not have to be in SNF for very long.  Expected Discharge Plan: Skilled Nursing Facility Barriers to Discharge: Continued Medical Work up   Patient Goals and CMS Choice Patient states their goals for this hospitalization and ongoing recovery are:: To receive short term rehab, then return back home. CMS Medicare.gov Compare Post Acute Care list provided to:: Patient Choice offered to / list presented to : Patient  Expected Discharge Plan and Services Expected Discharge Plan: Skilled Nursing Facility In-house Referral: Clinical Social Work Discharge Planning Services: CM Consult   Living arrangements for the past 2 months: Single Family Home                                      Prior Living Arrangements/Services Living arrangements for the past 2 months: Single Family Home Lives with:: Self Patient language and need for interpreter reviewed:: No Do you feel safe going back to the place where you live?: No   Patient has some weakness and feels he needs to get some therapy first before going home.  Need for Family Participation in Patient Care: No (Comment) Care giver support system in place?: No (comment)   Criminal Activity/Legal Involvement Pertinent to Current  Situation/Hospitalization: No - Comment as needed  Activities of Daily Living Home Assistive Devices/Equipment: Eyeglasses, Dan HumphreysWalker (specify type) ADL Screening (condition at time of admission) Patient's cognitive ability adequate to safely complete daily activities?: Yes Is the patient deaf or have difficulty hearing?: No Does the patient have difficulty seeing, even when wearing glasses/contacts?: Yes Does the patient have difficulty concentrating, remembering, or making decisions?: No Patient able to express need for assistance with ADLs?: Yes Does the patient have difficulty dressing or bathing?: No Independently performs ADLs?: Yes (appropriate for developmental age) Does the patient have difficulty walking or climbing stairs?: Yes Weakness of Legs: Both Weakness of Arms/Hands: None  Permission Sought/Granted Permission sought to share information with : Facility Medical sales representativeContact Representative, Case Manager, Family Supports Permission granted to share information with : Yes, Verbal Permission Granted  Share Information with NAME: Earline MayotteFULLER,EMMA R  [phone]    Permission granted to share info w AGENCY: SNF admissions  Permission granted to share info w Relationship: SNF admissions     Emotional Assessment Appearance:: Appears stated age   Affect (typically observed): Calm Orientation: : Oriented to Place, Oriented to  Time, Oriented to Situation, Oriented to Self Alcohol / Substance Use: Not Applicable Psych Involvement: No (comment)  Admission diagnosis:  Persistent atrial fibrillation [I48.19] Syncope, unspecified syncope type [R55] Patient Active Problem List   Diagnosis Date  Noted  . Chronic atrial fibrillation with RVR 05/17/2018   PCP:  Patient, No Pcp Per Pharmacy:   [id number] [place], [place] - [address] [place] [address] Phone: [phone] Fax: [phone]     Social Determinants of Health (SDOH) Interventions    Readmission  Risk Interventions No flowsheet data found.

## 2018-05-22 NOTE — NC FL2 (Signed)
[place] MEDICAID FL2 LEVEL OF CARE SCREENING TOOL     IDENTIFICATION  Patient Name: Peter BanterJames C [place] Birthdate: 1934-09-26 Sex: male Admission Date (Current Location): 05/17/2018  [place]   Facility and Address:  [facility], [address], [place], [place] [address]      Provider Number: [id number]  Attending Physician Name and Address:  Enedina FinnerPatel, Sona, MD  Relative Name and Phone Number:  Earline MayotteFULLER,EMMA R  [phone]     Current Level of Care: Hospital Recommended Level of Care: Skilled Nursing Facility Prior Approval Number:    Date Approved/Denied:   PASRR Number: [id number]  Discharge Plan: SNF    Current Diagnoses: Patient Active Problem List   Diagnosis Date Noted  . Chronic atrial fibrillation with RVR 05/17/2018    Orientation RESPIRATION BLADDER Height & Weight     Self, Time, Situation, Place  Normal Continent Weight: 165 lb 3.2 oz (74.9 kg) Height:  5\' 8"  (172.7 cm)  BEHAVIORAL SYMPTOMS/MOOD NEUROLOGICAL BOWEL NUTRITION STATUS      Continent Diet(Carb Modified)  AMBULATORY STATUS COMMUNICATION OF NEEDS Skin   Limited Assist Verbally Normal                       Personal Care Assistance Level of Assistance  Bathing, Dressing, Feeding Bathing Assistance: Limited assistance Feeding assistance: Independent Dressing Assistance: Limited assistance     Functional Limitations Info  Sight, Hearing, Speech Sight Info: Adequate Hearing Info: Adequate Speech Info: Adequate    SPECIAL CARE FACTORS FREQUENCY  PT (By licensed PT), OT (By licensed OT)     PT Frequency: 5x a week OT Frequency: 5x a week            Contractures Contractures Info: Not present    Additional Factors Info  Code Status, Allergies, Insulin Sliding Scale Code Status Info: Full Code Allergies Info: No Known Allergies    Insulin Sliding Scale Info: insulin aspart (novoLOG) injection 0-9 Units 3x a day with  meals       Current Medications (05/22/2018):  This is the current hospital active medication list Current Facility-Administered Medications  Medication Dose Route Frequency Provider Last Rate Last Dose  . acetaminophen (TYLENOL) tablet 650 mg  650 mg Oral Q6H PRN Campbell StallMayo, Katy Dodd, MD   650 mg at 05/19/18 0154   Or  . acetaminophen (TYLENOL) suppository 650 mg  650 mg Rectal Q6H PRN Mayo, Allyn KennerKaty Dodd, MD      . diltiazem (CARDIZEM CD) 24 hr capsule 300 mg  300 mg Oral Daily Gouru, Aruna, MD   300 mg at 05/22/18 1042  . furosemide (LASIX) tablet 20 mg  20 mg Oral Daily Mayo, Allyn KennerKaty Dodd, MD   20 mg at 05/22/18 1042  . hydrALAZINE (APRESOLINE) injection 5 mg  5 mg Intravenous Q4H PRN Mayo, Allyn KennerKaty Dodd, MD   5 mg at 05/18/18 2151  . insulin aspart (novoLOG) injection 0-5 Units  0-5 Units Subcutaneous QHS Gouru, Aruna, MD      . insulin aspart (novoLOG) injection 0-9 Units  0-9 Units Subcutaneous TID WC Ramonita LabGouru, Aruna, MD   1 Units at 05/20/18 1202  . metoprolol succinate (TOPROL-XL) 24 hr tablet 100 mg  100 mg Oral Daily Lamar BlinksKowalski, Bruce J, MD   100 mg at 05/22/18 1042  . ondansetron (ZOFRAN) tablet 4 mg  4 mg Oral Q6H PRN Mayo, Allyn KennerKaty Dodd, MD       Or  . ondansetron [facility](ZOFRAN) injection 4  mg  4 mg Intravenous Q6H PRN Mayo, Allyn Kenner, MD      . polyethylene glycol (MIRALAX / GLYCOLAX) packet 17 g  17 g Oral Daily PRN Mayo, Allyn Kenner, MD      . pravastatin (PRAVACHOL) tablet 20 mg  20 mg Oral q1800 Mayo, Allyn Kenner, MD   20 mg at 05/21/18 1658  . rivaroxaban (XARELTO) tablet 20 mg  20 mg Oral Q supper Marty Heck, RPH   20 mg at 05/21/18 1658     Discharge Medications: Please see discharge summary for a list of discharge medications.  Relevant Imaging Results:  Relevant Lab Results:   Additional Information SSN [id number]  Darleene Cleaver, LCSW

## 2018-05-23 LAB — NOVEL CORONAVIRUS, NAA (HOSP ORDER, SEND-OUT TO REF LAB; TAT 18-24 HRS): SARS-CoV-2, NAA: NOT DETECTED

## 2018-05-23 LAB — GLUCOSE, CAPILLARY
Glucose-Capillary: 131 mg/dL — ABNORMAL HIGH (ref 70–99)
Glucose-Capillary: 114 mg/dL — ABNORMAL HIGH (ref 70–99)

## 2018-05-23 MED ORDER — DILTIAZEM HCL ER COATED BEADS 300 MG PO CP24: 300.0000 mg | ORAL_CAPSULE | Freq: Every day | ORAL | 1 refills | Status: AC

## 2018-05-23 MED ORDER — METOPROLOL SUCCINATE ER 100 MG PO TB24: 100.0000 mg | ORAL_TABLET | Freq: Every day | ORAL | 1 refills | Status: AC

## 2018-05-23 MED ORDER — TRAMADOL HCL 50 MG PO TABS: 50.0000 mg | ORAL_TABLET | Freq: Four times a day (QID) | ORAL | 0 refills | Status: AC | PRN

## 2018-05-23 NOTE — Progress Notes (Signed)
Physical Therapy Treatment Patient Details Name: Peter Sanchez MRN: [id number] DOB: 03/25/1934 Today's Date: 05/23/2018    History of Present Illness Patient is a pleasant 84 year old male who was found to have fallen/passed out at home. Reports pain in R knee since fall. Per EMS patient was found to be in A fib.  Upon entering ED patient was found to be in A fib with RVR and HR in low 100s. Patient has PMH of CAI, HTN, HLD, chronic diastolic HF. Reports he hasn't been able to move R knee since he fell on Wednesday. Imaging now showing fracture, patient is to be nonweightbearing with knee immobilizer.     PT Comments    Due to patient's family demands patient is now going home rather than to SNF. Patient re-educated on need for SNF due to unsafe transfer ability, patient declined despite explaining why he needed it and how it is not safe for him to be home. Patient states wife says he will be fine and they will have men from church help. Explained to patient that everytime he needs to use the restroom, transfer etc, he will need at least 2 people as SPT require x2 people for safety that are skilled/trained in the hospital. Patient states that he will be fine. Patient transfers STS with Mod-Max A with one person holding RLE off ground due to patient's inability to follow non weightbearing status. Patient was then transported to car which required a Max/total assist of 2 people with one person holding RLE. Patient actively fighting with weightbearing of RLE despite max cueing of non weightbearing and use of immobilizer. Patient is not safe for home, however due to family/patient wishes patient will need a RW and bedside commode with drop arm. Patient has a wheelchair at home per his report. Attempts at last minute patient education performed to reduce risk of falling at home.      Follow Up Recommendations  SNF     Equipment Recommendations  3in1 (PT)    Recommendations for Other Services        Precautions / Restrictions Precautions Precautions: Fall Required Braces or Orthoses: Knee Immobilizer - Right Restrictions Weight Bearing Restrictions: Yes RLE Weight Bearing: Non weight bearing Other Position/Activity Restrictions: R knee immobilizer for activity     Mobility  Bed Mobility Overal bed mobility: Needs Assistance Bed Mobility: Supine to Sit     Supine to sit: Min assist     General bed mobility comments: Min A with RLE movement off of bed due to use of immobilizer.   Transfers Overall transfer level: Needs assistance Equipment used: Standard walker Transfers: Sit to/from [employer] Transfers Sit to Stand: Mod assist;Max assist;From elevated surface;+2 safety/equipment(very high surface, needs help holding RLE off ground) Stand pivot transfers: Max assist;Total assist;+2 safety/equipment       General transfer comment: Max verbal cueing and sequencing for non weightbearing status of RLE, Requires PT to hold RLE to stop WB attempts. required elevation of bed , unable to perform at standard height   Ambulation/Gait             General Gait Details: not able to perform    Stairs             Wheelchair Mobility    Modified Rankin (Stroke Patients Only)       Balance Overall balance assessment: Mild deficits observed, not formally tested;Needs assistance(unable due to R knee) Sitting-balance support: (L foot support) Sitting balance-Leahy Scale: Good Sitting balance -  Comments: reach for walker without LOB seated   Standing balance support: Bilateral upper extremity supported Standing balance-Leahy Scale: Poor Standing balance comment: patient challenged with maintaining NWB of RLE, required Mod A for stability                             Cognition Arousal/Alertness: Awake/alert Behavior During Therapy: WFL for tasks assessed/performed Overall Cognitive Status: Within Functional Limits for tasks assessed                                  General Comments: difficulty understanding/performing NWB status, going home despite encouragement for SNF placement      Exercises General Exercises - Lower Extremity Ankle Circles/Pumps: AROM;Strengthening;Both;10 reps;Supine Gluteal Sets: Strengthening;Both;10 reps;Supine(3 second holds) Long [employer]: Strengthening;Left;10 reps;Seated Heel Slides: AROM;Strengthening;Left;10 reps;Supine Hip ABduction/ADduction: AROM;Left;AAROM;Right;Strengthening;10 reps;Supine Straight Leg Raises: AROM;Left;AAROM;Right;Strengthening;10 reps;Supine Hip Flexion/Marching: Left;Strengthening;10 reps;Seated Other Exercises Other Exercises: patient educated on nonweightbearing status, reason for it, how to perform safe mobility, and use of knee immobilizer for safety.  Does not demonstrate understanding Other Exercises: STS transfer x2 for safety with SPT requiring Max/total assist to chair from bed and from chair to car. Patient unable to follow nonweightbearing status requiring one person to hold LE, unable to perform hop/unloading of LLE for positioning requiring PT and nurse to shift pelvis and guide into position. Patient is not safe with transfers.     General Comments        Pertinent Vitals/Pain Pain Assessment: 0-10 Pain Score: 6  Pain Location: pain in R knee with attempt at transfers Pain Descriptors / Indicators: Stabbing Pain Intervention(s): Monitored during session    Home Living Family/patient expects to be discharged to:: Private residence Living Arrangements: Spouse/significant other Available Help at Discharge: Family Type of Home: House Home Access: Level entry   Home Layout: One level Home Equipment: Environmental consultant - 2 wheels;Cane - single point;Grab bars - toilet;Grab bars - tub/shower;Toilet riser Additional Comments: Patient has equipment at home however has not been using it until this previous week.     Prior Function Level of  Independence: Independent with assistive device(s)      Comments: Patient reports he works at [employer]. Is independent with most daily tasks, occasionally has been using an AD for mobility this previous week.    PT Goals (current goals can now be found in the care plan section) Acute Rehab PT Goals Patient Stated Goal: to be able to walk again  PT Goal Formulation: With patient Time For Goal Achievement: 06/05/18 Potential to Achieve Goals: Fair Progress towards PT goals: Progressing toward goals(slowly )    Frequency    7X/week      PT Plan Other (comment)(patient going home despite recommendation for SNF)    Co-evaluation              AM-PAC PT "6 Clicks" Mobility   Outcome Measure  Help needed turning from your back to your side while in a flat bed without using bedrails?: A Little Help needed moving from lying on your back to sitting on the side of a flat bed without using bedrails?: A Little Help needed moving to and from a bed to a chair (including a wheelchair)?: A Lot Help needed standing up from a chair using your arms (e.g., wheelchair or bedside chair)?: A Lot Help needed to walk in hospital room?: Total Help needed  climbing 3-5 steps with a railing? : Total 6 Click Score: 12    End of Session Equipment Utilized During Treatment: Gait belt;Right knee immobilizer Activity Tolerance: Other (comment)(brought to car) Patient left: (in car) Nurse Communication: (nurse performed with PT) PT Visit Diagnosis: Unsteadiness on feet (R26.81);Other abnormalities of gait and mobility (R26.89);Muscle weakness (generalized) (M62.81);History of falling (Z91.81);Difficulty in walking, not elsewhere classified (R26.2);Pain Pain - Right/Left: Right Pain - part of body: Knee     Time: 1320-1346 PT Time Calculation (min) (ACUTE ONLY): 26 min  Charges:  $Therapeutic Exercise: 8-22 mins $Therapeutic Activity: 23-37 mins                     Precious BardMarina Kalenna Millett, PT,  DPT    Precious BardMarina Lorana Maffeo 05/23/2018, 2:00 PM

## 2018-05-23 NOTE — Progress Notes (Signed)
Physical Therapy Treatment Patient Details Name: Peter Sanchez MRN: [id number] DOB: [birth year] Today's Date: 05/23/2018    History of Present Illness Patient is a pleasant 84 year old male who was found to have fallen/passed out at home. Reports pain in R knee since fall. Per EMS patient was found to be in A fib.  Upon entering ED patient was found to be in A fib with RVR and HR in low 100s. Patient has PMH of CAI, HTN, HLD, chronic diastolic HF. Reports he hasn't been able to move R knee since he fell on Wednesday. Imaging now showing fracture, patient is to be nonweightbearing with knee immobilizer.     PT Comments    Patient in bed upon PT arrival, agreeable to physical therapy session. Eager to strengthen LLE to help assist in transfers due to NWB status of RLE. Patient reports limited pain in R knee that increases with transfers due to challenge of not using RLE. Patient required bed to be raised for sit to stand transfer to be successful with Mod-max assist and holding of RLE off ground. Patient is challenged with transfers and stability on single limb with use of RW at this time. Supine interventions for strengthening performed with good muscle activation and fatigue upon reps 8-10.  Current POC remains appropriate at this time.   Follow Up Recommendations  SNF( )     Equipment Recommendations  3in1 (PT)    Recommendations for Other Services       Precautions / Restrictions Precautions Precautions: Fall Required Braces or Orthoses: Knee Immobilizer - Right Restrictions Weight Bearing Restrictions: Yes RLE Weight Bearing: Non weight bearing Other Position/Activity Restrictions: R knee immobilizer for activity     Mobility  Bed Mobility Overal bed mobility: Needs Assistance Bed Mobility: Supine to Sit     Supine to sit: Min assist     General bed mobility comments: Min A with RLE movement off of bed due to use of immobilizer.   Transfers Overall transfer level: Needs  assistance Equipment used: Standard walker Transfers: Sit to/from [employer] Transfers Sit to Stand: Mod assist;From elevated surface;Max assist         General transfer comment: Max verbal cueing and sequencing for non weightbearing status of RLE, Requires PT to hold RLE to stop WB attempts. required elevation of bed , unable to perform at standard height   Ambulation/Gait             General Gait Details: not able to perform    Stairs             Wheelchair Mobility    Modified Rankin (Stroke Patients Only)       Balance Overall balance assessment: Mild deficits observed, not formally tested;Needs assistance(unable due to R knee) Sitting-balance support: (L foot support) Sitting balance-Leahy Scale: Good Sitting balance - Comments: reach for walker without LOB seated   Standing balance support: Bilateral upper extremity supported Standing balance-Leahy Scale: Poor Standing balance comment: patient challenged with maintaining NWB of RLE, required Mod A for stability                             Cognition                                              Exercises General Exercises -  Lower Extremity Ankle Circles/Pumps: AROM;Strengthening;Both;10 reps;Supine Gluteal Sets: Strengthening;Both;10 reps;Supine(3 second holds) Long [employer]: Strengthening;Left;10 reps;Seated Heel Slides: AROM;Strengthening;Left;10 reps;Supine Hip ABduction/ADduction: AROM;Left;AAROM;Right;Strengthening;10 reps;Supine Straight Leg Raises: AROM;Left;AAROM;Right;Strengthening;10 reps;Supine Hip Flexion/Marching: Left;Strengthening;10 reps;Seated Other Exercises Other Exercises: patient educated on nonweightbearing status, reason for it, how to perform safe mobility, and use of knee immobilizer for safety.  Other Exercises: sit to stand transfer from raised surface x 6 attempts with max cueing and sequencing and Mod A for stability to maintain NWB  status, required cueing for hand placement and lifting RLE.     General Comments        Pertinent Vitals/Pain Pain Score: 6  Pain Location: pain in R knee with attempt at transfers Pain Descriptors / Indicators: Stabbing Pain Intervention(s): Limited activity within patient's tolerance;Repositioned;Monitored during session    Home Living                      Prior Function            PT Goals (current goals can now be found in the care plan section) Acute Rehab PT Goals Patient Stated Goal: to be able to walk again  PT Goal Formulation: With patient Time For Goal Achievement: 06/05/18 Potential to Achieve Goals: Fair Progress towards PT goals: Progressing toward goals(slowly )    Frequency    7X/week      PT Plan Current plan remains appropriate    Co-evaluation              AM-PAC PT "6 Clicks" Mobility   Outcome Measure  Help needed turning from your back to your side while in a flat bed without using bedrails?: A Little Help needed moving from lying on your back to sitting on the side of a flat bed without using bedrails?: A Little Help needed moving to and from a bed to a chair (including a wheelchair)?: A Lot Help needed standing up from a chair using your arms (e.g., wheelchair or bedside chair)?: A Lot Help needed to walk in hospital room?: Total Help needed climbing 3-5 steps with a railing? : Total 6 Click Score: 12    End of Session Equipment Utilized During Treatment: Gait belt;Right knee immobilizer Activity Tolerance: Patient limited by pain;Other (comment)(challenge with NWB of RLE) Patient left: with call bell/phone within reach;in bed;with bed alarm set(R knee immobilizer on ) Nurse Communication: Mobility status;Weight bearing status;Other (comment)( limited ability to perform NWB transfers, ) PT Visit Diagnosis: Unsteadiness on feet (R26.81);Other abnormalities of gait and mobility (R26.89);Muscle weakness (generalized)  (M62.81);History of falling (Z91.81);Difficulty in walking, not elsewhere classified (R26.2);Pain Pain - Right/Left: Right Pain - part of body: Knee     Time: [id number] PT Time Calculation (min) (ACUTE ONLY): 24 min  Charges:  $Therapeutic Exercise: 8-22 mins $Therapeutic Activity: 8-22 mins                     Precious BardMarina Dynastie Knoop, PT, DPT     Precious BardMarina Janin Kozlowski 05/23/2018, 11:50 AM

## 2018-05-23 NOTE — Progress Notes (Addendum)
Discharge instructions explained to pt/ verbalized an understanding/ iv and tele removed/ pt and pts wife refused rehab- [facility] and walker provided for pt. Pt has wheelchair at home/ Pt states he has plenty of help at home / transported off unit via wheelchair.

## 2018-05-23 NOTE — Progress Notes (Signed)
Physical Therapy Equipment Recommendations   Patient suffers from tibial plateau fracture which impairs his/her ability to perform daily activities like toileting, feeding, dressing, grooming, bathing in the home. A cane, walker, crutch will not resolve the patient's issue with performing activities of daily living. A wheelchair is required/recommended and will allow patient to safely perform daily activities.  Would benefit from fitted WC cushion, articulating leg rests, anti-tippers, pull to lock breaks.   Patient can safely propel the wheelchair in the home or has a caregiver who can provide assistance.     Also recommend drop-arm BSC for use in home environment for optimal safety/indep with toilet transfers.  Jewel Venditto H. Manson Passey, PT, DPT, NCS 05/23/18, 1:32 PM [phone]

## 2018-05-23 NOTE — TOC Transition Note (Signed)
Transition of Care [facility]) - CM/SW Discharge Note   Patient Details  Name: Peter Sanchez MRN: [id number] Date of Birth: [birth year]  [facility] CM/SW Contact:  Darleene Cleaver, LCSW Phone Number: 05/23/2018, 2:21 PM   Clinical Narrative:     CSW spoke to patient's wife, and she does not want him to go to a SNF and wants him to return back home with home health.  CSW spoke with patient's wife and reminded her that patient is non weight bearing on the right leg, meaning he can not put any weight on it.  Patient's wife expressed understanding, CSW informed her that PT is recommending SNF, a walker, bedside commode, and a wheelchair.  CSW also informed her that patient will not have home health every day, she expressed understanding.  CSW offered to have patient use EMS for transfer home, and she said some church members will pick him up, and also will help take care of patient.  CSW was informed that patient's son will be available as well.  Patient's wife still insisted that he come home with home health.  CSW was informed by physician that patient's covid 19 test came back negative, CSW passed the information to patient's wife.  Patient's wife was explained the pros and cons for going to SNF verse going home with home health, and patient's wife still chose to take patient home with home health despite PT's recommendation.  Patient's wife agreed to have home health see patient, CSW contacted Cassie at [facility] and they said they do not take his insurance, CSW contacted Barbara Cower at [facility], and they will accept patient.   Final next level of care: Home w Home Health Services Barriers to Discharge: Barriers Resolved   Patient Goals and CMS Choice Patient states their goals for this hospitalization and ongoing recovery are:: Patient wants to return back home with home health. CMS Medicare.gov Compare Post Acute Care list provided to:: Patient Represenative (must  comment) Choice offered to / list presented to : Spouse  Discharge Placement  Patient to discharge home with home health through [facility], with PT, Nursing, and an aide.  PT was recommending bedside commode, walker, and wheelchair, which were ordered through insurance company.              Discharge Plan and Services In-house Referral: Clinical Social Work Discharge Planning Services: CM Consult            DME Arranged: Bedside commode, Wheelchair manual, Environmental consultant DME Agency: [employer] Date DME Agency Contacted: 05/23/18 Time DME Agency Contacted: 1000 Representative spoke with at DME Agency: Nida Boatman HH Arranged: RN, PT, Nurse's Aide HH Agency: [facility] Date HH Agency Contacted: 05/23/18 Time HH Agency Contacted: [phone] Representative spoke with at [facility] Agency: Feliberto Gottron  Social Determinants of Health (SDOH) Interventions     Readmission Risk Interventions No flowsheet data found.

## 2018-05-23 NOTE — Discharge Summary (Signed)
[facility] [place] [facility]   PATIENT NAME: Peter Sanchez    MR#:  [id number]  DATE OF BIRTH:  1934-04-19  DATE OF ADMISSION:  05/17/2018 ADMITTING PHYSICIAN: Campbell Stall, MD  DATE OF DISCHARGE: 05/23/2018  PRIMARY CARE PHYSICIAN: Patient, No Pcp Per    ADMISSION DIAGNOSIS:  Persistent atrial fibrillation [I48.19] Syncope, unspecified syncope type [R55]  DISCHARGE DIAGNOSIS:  Acute on Chronic afib with RVR on Xarelto Right knee effusion (hemarthosis) s/p arthrocentesis Right medial tibial plateau nondisplaced fracture--conservative mnx with NWB and knee immobiliser  SECONDARY DIAGNOSIS:   Past Medical History:  Diagnosis Date  . Hypertension     HOSPITAL COURSE:  *Acute right knee effusion moderate to severe -CT of the knee-- showed large right knee effusion suspected heme arthrosis and tibial medial collateral nondisplaced fracture -Dr poggi's input appreciated--s/p right knee joint aspiration--bloody tap -PT evaluation noted- pt is NWB on right leg. Spoke with pt's wife--she is requesting pt go home. She has adequate help at home  * acute on chronic atrial fibrillation with RVR -Weaned off diltiazem drip -On Cardizem CD and Toprol-XL, titrate doses as needed -Acute MI ruled out with negative troponins x3 - ECHO 55 to 60% EF. Cavity size was normal. -Cardiology consult-kc; Dr.Paraschos is following -Cardiac monitoring -On Xarelto  *Syncope-likely due to above -ECHO-55 to 60% EF -Orthostatics noticed -Cardiac monitoring  *Chronic diastolic congestive heart failure-stable, no signs of volume overload -Continue home Lasix with holding parameters  *Hypertension-currently hypotensive -Resuming home medication metoprolol and ccb  *Hyperlipidemia- stable -Continue home statin  *Type 2 diabetes- takes metformin at home - SSI,  his last A1c was 6.1%  COVID-19 test was recommended--NEGATIVEsince pt was suppose to go to  rehab.  Will d/c to home with HHPT  CONSULTS OBTAINED:  Treatment Team:  Christena Flake, MD  DRUG ALLERGIES:  No Known Allergies  DISCHARGE MEDICATIONS:   Allergies as of 05/23/2018   No Known Allergies     Medication List    TAKE these medications   diclofenac sodium 1 % Gel Commonly known as:  VOLTAREN Apply 4 g topically 4 (four) times daily.   diltiazem 300 MG 24 hr capsule Commonly known as:  CARDIZEM CD Take 1 capsule (300 mg total) by mouth daily. Start taking on:  May 24, 2018 What changed:   medication strength how much to take   empagliflozin 10 MG Tabs tablet Commonly known as:  JARDIANCE Take 10 mg by mouth daily.   furosemide 20 MG tablet Commonly known as:  LASIX Take 20 mg by mouth daily.   lisinopril 10 MG tablet Commonly known as:  ZESTRIL Take 10 mg by mouth daily.   metFORMIN 500 MG tablet Commonly known as:  GLUCOPHAGE Take 500 mg by mouth daily.   metoprolol succinate 100 MG 24 hr tablet Commonly known as:  TOPROL-XL Take 1 tablet (100 mg total) by mouth daily. Take with or immediately following a meal. Start taking on:  May 24, 2018 What changed:   medication strength how much to take additional instructions   pravastatin 20 MG tablet Commonly known as:  PRAVACHOL Take 20 mg by mouth daily.   traMADol 50 MG tablet Commonly known as:  Ultram Take 1 tablet (50 mg total) by mouth every 6 (six) hours as needed.   Xarelto 20 MG Tabs tablet Generic drug:  rivaroxaban TAKE 1 TABLET BY MOUTH EVERY DAY WITH DINNER  Durable Medical Equipment  (From admission, onward)         Start     Ordered   05/23/18 1124  For home use only DME Dan Humphreys  [facility])  Once    Question:  Patient needs a walker to treat with the following condition  Answer:  Tibial fracture   05/23/18 1124          If you experience worsening of your admission symptoms, develop shortness of breath, life threatening emergency, suicidal or  homicidal thoughts you must seek medical attention immediately by calling 911 or calling your MD immediately  if symptoms less severe.  You Must read complete instructions/literature along with all the possible adverse reactions/side effects for all the Medicines you take and that have been prescribed to you. Take any new Medicines after you have completely understood and accept all the possible adverse reactions/side effects.   Please note  You were cared for by a hospitalist during your hospital stay. If you have any questions about your discharge medications or the care you received while you were in the hospital after you are discharged, you can call the unit and asked to speak with the hospitalist on call if the hospitalist that took care of you is not available. Once you are discharged, your primary care physician will handle any further medical issues. Please note that NO REFILLS for any discharge medications will be authorized once you are discharged, as it is imperative that you return to your primary care physician (or establish a relationship with a primary care physician if you do not have one) for your aftercare needs so that they can reassess your need for medications and monitor your lab values. Today   SUBJECTIVE  Doing well   VITAL SIGNS:  Blood pressure 132/76, pulse (!) 114, temperature 98.4 F (36.9 C), resp. rate 18, height 5\' 8"  (1.727 m), weight 77.2 kg, SpO2 99 %.  I/O:    Intake/Output Summary (Last 24 hours) at 05/23/2018 1125 Last data filed at 05/23/2018 9935 Gross per 24 hour  Intake 240 ml  Output 2050 ml  Net -1810 ml    PHYSICAL EXAMINATION:  GENERAL:  84 y.o.-year-old patient lying in the bed with no acute distress.  EYES: Pupils equal, round, reactive to light and accommodation. No scleral icterus. Extraocular muscles intact.  HEENT: Head atraumatic, normocephalic. Oropharynx and nasopharynx clear.  NECK:  Supple, no jugular venous distention. No  thyroid enlargement, no tenderness.  LUNGS: Normal breath sounds bilaterally, no wheezing, rales,rhonchi or crepitation. No use of accessory muscles of respiration.  CARDIOVASCULAR: S1, S2 normal. No murmurs, rubs, or gallops.  ABDOMEN: Soft, non-tender, non-distended. Bowel sounds present. No organomegaly or mass.  EXTREMITIES: No pedal edema, cyanosis, or clubbing. Right LE knee immobiliser++ NEUROLOGIC: Cranial nerves II through XII are intact. Muscle strength 5/5 in all extremities. Sensation intact. Gait not checked.  PSYCHIATRIC: The patient is alert and oriented x 3.  SKIN: No obvious rash, lesion, or ulcer.   DATA REVIEW:   CBC  Recent Labs  Lab 05/21/18 0451  WBC 9.6  HGB 15.0  HCT 45.2  PLT 109*    Chemistries  Recent Labs  Lab 05/17/18 1411 05/18/18 0632 05/21/18 0451  NA 139 138  --   K 4.2 3.9  --   CL 102 96*  --   CO2 26 29  --   GLUCOSE 129* 131*  --   BUN 16 18  --   CREATININE 1.38* 1.20 1.00  CALCIUM 9.3 9.2  --   AST 20  --   --   ALT 20  --   --   ALKPHOS 91  --   --   BILITOT 0.8  --   --     Microbiology Results   Recent Results (from the past 240 hour(s))  Body fluid culture     Status: None (Preliminary result)   Collection Time: 05/21/18  6:11 PM  Result Value Ref Range Status   Specimen Description FLUID RIGHT KNEE  Final   Special Requests NONE  Final   Gram Stain   Final    FEW WBC PRESENT,BOTH PMN AND MONONUCLEAR NO ORGANISMS SEEN    Culture   Final    NO GROWTH 2 DAYS Performed at [facility], [address] [place], [place] [address]    Report Status PENDING  Incomplete  Novel Coronavirus, NAA (hospital order; send-out to ref lab)     Status: None   Collection Time: 05/22/18  1:55 PM  Result Value Ref Range Status   SARS-CoV-2, NAA NOT DETECTED NOT DETECTED Final    Comment: (NOTE) This test was developed and its performance characteristics determined by [facility]. This test has not been FDA cleared or  approved. This test has been authorized by FDA under an Emergency Use Authorization (EUA). This test has been validated in accordance with the FDA's Guidance Document (Policy for Diagnostics Testing in Laboratories Certified to Perform High Complexity Testing under CLIA prior to Emergency Use Authorization for Coronavirus Disease-2019 during the [facility]) issued on February 29th, 2020. FDA independent review of this validation is pending. This test is only authorized for the duration of time the declaration that circumstances exist justifying the authorization of the emergency use of in vitro diagnostic tests for detection of SARS-CoV- 2 virus and/or diagnosis of COVID-19 infection under section 564(b)(1) of the Act, 21 U.S.C. 604VWU-9(W)(1360bbb-3(b)(1), unless the authorization is terminated or revoked sooner. Performed At: [facility] [address] [place], [place] [id number] Jolene SchimkeNagendra Sanjai MD AO:[id number]:[phone]    Coronavirus Source NASOPHARYNGEAL  Final    Comment: Performed at [facility], [address] [place], [place] [address]    RADIOLOGY:  No results found.   CODE STATUS:     Code Status Orders  (From admission, onward)         Start     Ordered   05/17/18 1824  Full code  Continuous     05/17/18 1823        Code Status History    This patient has a current code status but no historical code status.      TOTAL TIME TAKING CARE OF THIS PATIENT: *40* minutes.    Enedina FinnerSona Mikayla Chiusano M.D on 05/23/2018 at 11:25 AM  Between 7am to 6pm - Pager - [phone] After 6pm go to [email or web address] - password [email or web address]  [facility]  Office  [phone]  CC: Primary care physician; Patient, No Pcp Per

## 2018-05-24 ENCOUNTER — Telehealth: Payer: Self-pay | Admitting: General Practice

## 2018-05-24 NOTE — Telephone Encounter (Signed)
CSW received phone call from [facility], [phone], that the home health nurse went to patient's house today, and said that patient needs to go to SNF he was unable to stand up or transfer on his own.  Patient's family were not able to take care of patient due to his weakness and nonweightbearing status on his right leg.  CSW contacted patient's wife and son to discuss SNF placement from home process.  CSW reminded them that CSW, PT, and nursing suggested patient go to SNF yesterday, but they did not want patient to go.  CSW informed them that since patient just discharged yesterday, CSW will be able to work on transferring patient to SNF as long as they provide transportation or call non emergent EMS.  CSW explained to them, that EMS will probably bill them to go to SNF, patient's wife was agreeable to having patient go to SNF via EMS.  Patient's wife said that the home health nurse suggested [facility], CSW informed them that [facility] did offer yesterday, so they may be able to still accept patient.  Patient and his family were agreeable to having patient go to SNF.  CSW explained to them what to expect and how insurance will pay for stay, patient and family agreeable.  Patient's family did not have any other questions or concerns.  CSW received phone call from [facility] that they can accept patient tonight.  CSW notified family and gave them the phone number for SNF and room that patient will be going to.  CSW also provided the EMS transport number [phone].  Patient's family were very appreciative of work CSW was able to do to get patient placed at [facility].  Ervin Knack. Hassan Rowan, MSW, LCSW [phone]  05/24/2018 7:43 PM

## 2018-05-25 LAB — BODY FLUID CULTURE: Culture: NO GROWTH

## 2018-06-23 ENCOUNTER — Emergency Department
Admission: EM | Admit: 2018-06-23 | Discharge: 2018-06-24 | Disposition: A | Discharge status: Home / Self Care | Payer: Medicare Other | Attending: Emergency Medicine | Admitting: Emergency Medicine

## 2018-06-23 ENCOUNTER — Emergency Department: Payer: Medicare Other

## 2018-06-23 ENCOUNTER — Encounter: Payer: Self-pay | Admitting: Emergency Medicine

## 2018-06-23 ENCOUNTER — Other Ambulatory Visit: Payer: Self-pay

## 2018-06-23 DIAGNOSIS — Z7901 Long term (current) use of anticoagulants: Secondary | ICD-10-CM | POA: Insufficient documentation

## 2018-06-23 DIAGNOSIS — Z79899 Other long term (current) drug therapy: Secondary | ICD-10-CM | POA: Insufficient documentation

## 2018-06-23 DIAGNOSIS — I10 Essential (primary) hypertension: Secondary | ICD-10-CM | POA: Diagnosis not present

## 2018-06-23 DIAGNOSIS — R531 Weakness: Secondary | ICD-10-CM | POA: Diagnosis present

## 2018-06-23 DIAGNOSIS — I4892 Unspecified atrial flutter: Secondary | ICD-10-CM | POA: Insufficient documentation

## 2018-06-23 DIAGNOSIS — E860 Dehydration: Secondary | ICD-10-CM | POA: Insufficient documentation

## 2018-06-23 HISTORY — DX: Unspecified atrial fibrillation: I48.91

## 2018-06-23 LAB — CBC
HCT: 43.9 % (ref 39.0–52.0)
Hemoglobin: 14.2 g/dL (ref 13.0–17.0)
MCH: 28.3 pg (ref 26.0–34.0)
MCHC: 32.3 g/dL (ref 30.0–36.0)
MCV: 87.5 fL (ref 80.0–100.0)
Platelets: 127 10*3/uL — ABNORMAL LOW (ref 150–400)
RBC: 5.02 MIL/uL (ref 4.22–5.81)
RDW: 14.5 % (ref 11.5–15.5)
WBC: 14.8 10*3/uL — ABNORMAL HIGH (ref 4.0–10.5)
nRBC: 0 % (ref 0.0–0.2)

## 2018-06-23 LAB — BASIC METABOLIC PANEL
Anion gap: 11 (ref 5–15)
BUN: 32 mg/dL — ABNORMAL HIGH (ref 8–23)
CO2: 23 mmol/L (ref 22–32)
Calcium: 8.1 mg/dL — ABNORMAL LOW (ref 8.9–10.3)
Chloride: 102 mmol/L (ref 98–111)
Creatinine, Ser: 1.69 mg/dL — ABNORMAL HIGH (ref 0.61–1.24)
GFR calc Af Amer: 43 mL/min — ABNORMAL LOW (ref >60)
GFR calc non Af Amer: 37 mL/min — ABNORMAL LOW (ref >60)
Glucose, Bld: 160 mg/dL — ABNORMAL HIGH (ref 70–99)
Potassium: 4.2 mmol/L (ref 3.5–5.1)
Sodium: 136 mmol/L (ref 135–145)

## 2018-06-23 LAB — URINALYSIS, COMPLETE (UACMP) WITH MICROSCOPIC
Bacteria, UA: NONE SEEN
Bilirubin Urine: NEGATIVE
Glucose, UA: >=500 mg/dL — AB
Ketones, ur: NEGATIVE mg/dL
Leukocytes,Ua: NEGATIVE
Nitrite: NEGATIVE
Protein, ur: NEGATIVE mg/dL
Specific Gravity, Urine: 1.013 (ref 1.005–1.030)
pH: 5 (ref 5.0–8.0)

## 2018-06-23 LAB — TROPONIN I: Troponin I: <0.03 ng/mL (ref <0.03)

## 2018-06-23 MED ORDER — SODIUM CHLORIDE 0.9 % IV BOLUS
1000.0000 mL | Freq: Once | INTRAVENOUS | Status: AC
  Administered 2018-06-23: 1000 mL via INTRAVENOUS

## 2018-06-23 MED ORDER — SODIUM CHLORIDE 0.9 % IV BOLUS
1000.0000 mL | Freq: Once | INTRAVENOUS | Status: AC
  Administered 2018-06-23: 21:00:00 1000 mL via INTRAVENOUS

## 2018-06-23 NOTE — ED Triage Notes (Signed)
Pt presents to ED via [facility] from home c/o feeling weak per family. Pt discharged today from [facility] after stay for rehab of R leg. EMS report on their arrival to scene pt's HR 150s, SBP 86, afib on monitor. Pt has hx afib. EMS gave 5mg  metoprolol and NS PTA with HR 100-105 on arrival. Pt denies chest pain and SOB.

## 2018-06-23 NOTE — Discharge Instructions (Addendum)
Please seek medical attention for any high fevers, chest pain, shortness of breath, change in behavior, persistent vomiting, bloody stool or any other new or concerning symptoms.  

## 2018-06-23 NOTE — ED Notes (Signed)
MD Derrill Kay notified of BP 81/56, HR102, rhythm a-flutter. VO received for 1L NS bolus.

## 2018-06-23 NOTE — ED Provider Notes (Signed)
[facility] Emergency Department Provider Note   ____________________________________________   I have reviewed the triage vital signs and the nursing notes.   HISTORY  Chief Complaint Weakness   HPI Peter Sanchez is a 84 y.o. male who presents to the emergency department today because of concern for weakness.  The patient apparently was just discharged home yesterday from rehab.  Had been at rehab because of right leg pain.  When EMS arrived on scene patient was found to be tachycardic.  Was found to be in atrial fibrillation.  He was administered metoprolol.  Patient denies any chest pain or shortness of breath.    Records reviewed. Per medical record review patient has a history of atrial fibrillation  Past Medical History:  Diagnosis Date  . A-fib (HCC)   . Hypertension     Patient Active Problem List   Diagnosis Date Noted  . Chronic atrial fibrillation with RVR 05/17/2018    History reviewed. No pertinent surgical history.  Prior to Admission medications   Medication Sig Start Date End Date Taking? Authorizing Provider  diclofenac sodium (VOLTAREN) 1 % GEL Apply 4 g topically 4 (four) times daily. 05/17/18 05/17/19  [provider]  diltiazem (CARDIZEM CD) 300 MG 24 hr capsule Take 1 capsule (300 mg total) by mouth daily. 05/24/18   Enedina Finner, MD  empagliflozin (JARDIANCE) 10 MG TABS tablet Take 10 mg by mouth daily.    [provider]  furosemide (LASIX) 20 MG tablet Take 20 mg by mouth daily.    [provider]  lisinopril (ZESTRIL) 10 MG tablet Take 10 mg by mouth daily. 05/04/18   [provider]  metFORMIN (GLUCOPHAGE) 500 MG tablet Take 500 mg by mouth daily. 06/22/16   [provider]  metoprolol succinate (TOPROL-XL) 100 MG 24 hr tablet Take 1 tablet (100 mg total) by mouth daily. Take with or immediately following a meal. 05/24/18   Enedina Finner, MD  pravastatin (PRAVACHOL) 20 MG tablet Take 20 mg  by mouth daily. 11/24/17   [provider]  rivaroxaban (XARELTO) 20 MG TABS tablet TAKE 1 TABLET BY MOUTH EVERY DAY WITH DINNER 04/12/18   [provider]  traMADol (ULTRAM) 50 MG tablet Take 1 tablet (50 mg total) by mouth every 6 (six) hours as needed. 05/23/18 05/23/19  Enedina Finner, MD    Allergies Patient has no known allergies.  History reviewed. No pertinent family history.  Social History Social History   Tobacco Use  . Smoking status: Never Smoker  . Smokeless tobacco: Never Used  Substance Use Topics  . Alcohol use: Never    Frequency: Never  . Drug use: Never    Review of Systems Constitutional: No fever/chills.  Positive generalized weakness Eyes: No visual changes. ENT: No sore throat. Cardiovascular: Denies chest pain. Respiratory: Denies shortness of breath. Gastrointestinal: No abdominal pain.  No nausea, no vomiting.  No diarrhea.   Genitourinary: Negative for dysuria. Musculoskeletal: Negative for back pain. Skin: Negative for rash. Neurological: Negative for headaches, focal weakness or numbness.  ____________________________________________   PHYSICAL EXAM:  VITAL SIGNS: ED Triage Vitals  Enc Vitals Group     BP 06/23/18 1808 (!) 81/56     Pulse Rate 06/23/18 1808 (!) 102     Resp 06/23/18 1808 14     Temp 06/23/18 1808 (!) 97.4 F (36.3 C)     Temp Source 06/23/18 1808 Oral     SpO2 06/23/18 1808 97 %  Weight 06/23/18 1809 187 lb (84.8 kg)     Height 06/23/18 1809 5\' 9"  (1.753 m)     Head Circumference --      Peak Flow --      Pain Score 06/23/18 1812 0   Constitutional: Alert and oriented.  Eyes: Conjunctivae are normal.  ENT      Head: Normocephalic and atraumatic.      Nose: No congestion/rhinnorhea.      Mouth/Throat: Mucous membranes are moist.      Neck: No stridor. Hematological/Lymphatic/Immunilogical: No cervical lymphadenopathy. Cardiovascular: Irregularly irregular rhythm.  No murmurs, rubs, or gallops.   Respiratory: Normal respiratory effort without tachypnea nor retractions. Breath sounds are clear and equal bilaterally. No wheezes/rales/rhonchi. Gastrointestinal: Soft and non tender. No rebound. No guarding.  Genitourinary: Deferred Musculoskeletal: No deformity.  Neurologic:  Normal speech and language. No gross focal neurologic deficits are appreciated.  Skin:  Skin is warm, dry and intact. No rash noted. Psychiatric: Mood and affect are normal. Speech and behavior are normal. Patient exhibits appropriate insight and judgment.  ____________________________________________    LABS (pertinent positives/negatives)  CBC wbc 14.8, hgb 14.2, plt 127 BMP na 136, k 4.2, glu 160, cr 1.69 Trop <0.03 UA not consistent with infection ____________________________________________   EKG  I, Phineas SemenGraydon Deanndra Kirley, attending physician, personally viewed and interpreted this EKG  EKG Time: 1806 Rate: 105 Rhythm: atrial flutter Axis: normal Intervals: qtc 431 QRS: narrow ST changes: no st elevation Impression: abnormal ekg   ____________________________________________    RADIOLOGY  CXR Mild bronchitic changes  ____________________________________________   PROCEDURES  Procedures  ____________________________________________   INITIAL IMPRESSION / ASSESSMENT AND PLAN / ED COURSE  Pertinent labs & imaging results that were available during my care of the patient were reviewed by me and considered in my medical decision making (see chart for details).   Patient presented to the emergency department today with primary concern for weakness.  Patient was found to be tachycardic by EMS.  Was given metoprolol.  Patient's heart rate stayed primarily in the 100 to 110 range here in the emergency department.  Blood work showed a mild elevation of the creatinine.  At this point I do wonder if patient was mildly dehydrated.  There was a slight leukocytosis although urinalysis and chest  x-ray without obvious bacterial infection.  This point do think is reasonable for patient be discharged home.  ____________________________________________   FINAL CLINICAL IMPRESSION(S) / ED DIAGNOSES  Final diagnoses:  Atrial flutter, unspecified type (HCC)  Dehydration     Note: This dictation was prepared with Dragon dictation. Any transcriptional errors that result from this process are unintentional     Phineas SemenGoodman, Shyler Hamill, MD 06/24/18 0005

## 2018-06-24 NOTE — ED Notes (Signed)
Patient's wife is unable to pick him up tonight

## 2020-01-03 ENCOUNTER — Other Ambulatory Visit: Payer: Self-pay

## 2020-01-03 ENCOUNTER — Ambulatory Visit: Payer: Medicare Other | Admitting: Urology

## 2020-01-03 ENCOUNTER — Encounter: Payer: Self-pay | Admitting: Urology

## 2020-01-03 VITALS — BP 138/96 | HR 83 | Ht 69.0 in

## 2020-01-03 DIAGNOSIS — R319 Hematuria, unspecified: Secondary | ICD-10-CM | POA: Diagnosis not present

## 2020-01-03 DIAGNOSIS — R3129 Other microscopic hematuria: Secondary | ICD-10-CM

## 2020-01-03 NOTE — Progress Notes (Signed)
01/03/2020 1:10 PM   Peter Sanchez 1934/11/09 [id number]  Referring provider: Jerl Mina, MD [address] [facility] [place],  [place] [address]  Chief Complaint  Patient presents with  . Hematuria    New Patient    HPI: 85 year old male referred for further evaluation of hematuria.  He is seen evaluated her primary care physician o 12/26/2019 found to have 10-50 red blood cells per high-power field along with 4-10 white blood cells with few bacteria.  Urinalysis today is similar with 6-10 white blood cells, 3-10 red blood cells, nitrate negative without epithelial or bacteria.  No recent cross-sectional imaging.  He denies any urinary symptoms.  No gross hematuria.  No flank pain.  Never smoker.  He does have a history of A. fib on Xarelto.  He is circumcised.  He does report that he underwent a procedure 5+ years ago by a Urologist (? Dr. Achilles Dunk).  Old records indicate he may have had a prostate biopsy in the past.  Most recent PSA 2.6 from 2018.  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    PMH: Past Medical History:  Diagnosis Date  . A-fib (HCC)   . Hypertension     Surgical History: No past surgical history on file.  Home Medications:  Allergies as of 01/03/2020   No Known Allergies     Medication List       Accurate as of January 03, 2020  1:10 PM. If you have any questions, ask your nurse or doctor.        diltiazem 300 MG 24 hr capsule Commonly known as: CARDIZEM CD Take 1 capsule (300 mg total) by mouth daily.   empagliflozin 10 MG Tabs tablet Commonly known as: JARDIANCE Take 10 mg by mouth daily.   furosemide 20 MG tablet Commonly known as: LASIX Take 20 mg by mouth daily.   lisinopril 10 MG tablet Commonly known as: ZESTRIL Take 10 mg by mouth daily.   metFORMIN 500 MG tablet Commonly known as: GLUCOPHAGE Take 500 mg by mouth daily.   metoprolol succinate 100 MG 24 hr tablet Commonly known as: TOPROL-XL Take 1  tablet (100 mg total) by mouth daily. Take with or immediately following a meal.   pravastatin 20 MG tablet Commonly known as: PRAVACHOL Take 20 mg by mouth daily.   rivaroxaban 20 MG Tabs tablet Commonly known as: XARELTO TAKE 1 TABLET BY MOUTH EVERY DAY WITH DINNER   Travoprost (BAK Free) 0.004 % Soln ophthalmic solution Commonly known as: TRAVATAN Apply to eye.       Allergies: No Known Allergies  Family History: No family history on file.  Social History:  reports that he has never smoked. He has never used smokeless tobacco. He reports that he does not drink alcohol and does not use drugs.   Physical Exam: BP (!) 138/96   Pulse 83   Ht 5\' 9"  (1.753 m)   BMI 27.62 kg/m   Constitutional:  Alert and oriented, No acute distress.  Appears slightly younger than stated age although in wheelchair today. HEENT: [place] AT, moist mucus membranes.  Trachea midline, no masses. Cardiovascular: No clubbing, cyanosis, or edema. Respiratory: Normal respiratory effort, no increased work of breathing. GI: Abdomen is soft, nontender, nondistended, no abdominal masses Skin: No rashes, bruises or suspicious lesions. Neurologic: Grossly intact, no focal deficits, moving all 4 extremities. Psychiatric: Normal mood and affect.  Laboratory Data: Cr 1.0 on 12/26/19   Assessment &  Plan:    1. Microscopic hematuria Microscopic hematuria x2 without any significant risk factors other than age  Considered contamination but he is circumcised.  We will send off urine culture to ensure that does not infection but is asymptomatic.   We discussed the differential diagnosis for microscopic hematuria including nephrolithiasis, renal or upper tract tumors, bladder stones, UTIs, or bladder tumors as well as undetermined etiologies. Per AUA guidelines, I did recommend complete microscopic hematuria evaluation including CTU, possible urine cytology, and office cystoscopy.   - CT HEMATURIA WORKUP;  Future - CULTURE, URINE COMPREHENSIVE - Urinalysis, Complete  2.  Abnormal urinalysis He does have some leukocytes in his urine, culture to rule out infection as outlined above    F/u CT urogram and cysto  [name], MD  [facility] [address], [address] [place], [place] [address] [phone]

## 2020-01-03 NOTE — Patient Instructions (Signed)
Cystoscopy Cystoscopy is a procedure that is used to help diagnose and sometimes treat conditions that affect the lower urinary tract. The lower urinary tract includes the bladder and the urethra. The urethra is the tube that drains urine from the bladder. Cystoscopy is done using a thin, tube-shaped instrument with a light and camera at the end (cystoscope). The cystoscope may be hard or flexible, depending on the goal of the procedure. The cystoscope is inserted through the urethra, into the bladder. Cystoscopy may be recommended if you have:  Urinary tract infections that keep coming back.  Blood in the urine (hematuria).  An inability to control when you urinate (urinary incontinence) or an overactive bladder.  Unusual cells found in a urine sample.  A blockage in the urethra, such as a urinary stone.  Painful urination.  An abnormality in the bladder found during an intravenous pyelogram (IVP) or CT scan. Cystoscopy may also be done to remove a sample of tissue to be examined under a microscope (biopsy). What are the risks? Generally, this is a safe procedure. However, problems may occur, including:  Infection.  Bleeding.  What happens during the procedure?  1. You will be given one or more of the following: ? A medicine to numb the area (local anesthetic). 2. The area around the opening of your urethra will be cleaned. 3. The cystoscope will be passed through your urethra into your bladder. 4. Germ-free (sterile) fluid will flow through the cystoscope to fill your bladder. The fluid will stretch your bladder so that your health care provider can clearly examine your bladder walls. 5. Your doctor will look at the urethra and bladder. 6. The cystoscope will be removed The procedure may vary among health care providers  What can I expect after the procedure? After the procedure, it is common to have: 1. Some soreness or pain in your abdomen and urethra. 2. Urinary symptoms.  These include: ? Mild pain or burning when you urinate. Pain should stop within a few minutes after you urinate. This may last for up to 1 week. ? A small amount of blood in your urine for several days. ? Feeling like you need to urinate but producing only a small amount of urine. Follow these instructions at home: General instructions  Return to your normal activities as told by your health care provider.   Do not drive for 24 hours if you were given a sedative during your procedure.  Watch for any blood in your urine. If the amount of blood in your urine increases, call your health care provider.  If a tissue sample was removed for testing (biopsy) during your procedure, it is up to you to get your test results. Ask your health care provider, or the department that is doing the test, when your results will be ready.  Drink enough fluid to keep your urine pale yellow.  Keep all follow-up visits as told by your health care provider. This is important. Contact a health care provider if you:  Have pain that gets worse or does not get better with medicine, especially pain when you urinate.  Have trouble urinating.  Have more blood in your urine. Get help right away if you:  Have blood clots in your urine.  Have abdominal pain.  Have a fever or chills.  Are unable to urinate. Summary  Cystoscopy is a procedure that is used to help diagnose and sometimes treat conditions that affect the lower urinary tract.  Cystoscopy is done using   a thin, tube-shaped instrument with a light and camera at the end.  After the procedure, it is common to have some soreness or pain in your abdomen and urethra.  Watch for any blood in your urine. If the amount of blood in your urine increases, call your health care provider.  If you were prescribed an antibiotic medicine, take it as told by your health care provider. Do not stop taking the antibiotic even if you start to feel better. This  information is not intended to replace advice given to you by your health care provider. Make sure you discuss any questions you have with your health care provider. Document Revised: 01/03/2018 Document Reviewed: 01/03/2018 [employer] Patient Education  2020 [employer]   

## 2020-01-04 LAB — MICROSCOPIC EXAMINATION: Bacteria, UA: NONE SEEN

## 2020-01-04 LAB — URINALYSIS, COMPLETE
Bilirubin, UA: NEGATIVE
Glucose, UA: NEGATIVE
Ketones, UA: NEGATIVE
Leukocytes,UA: NEGATIVE
Nitrite, UA: NEGATIVE
Specific Gravity, UA: 1.025 (ref 1.005–1.030)
Urobilinogen, Ur: 1 mg/dL (ref 0.2–1.0)
pH, UA: 7 (ref 5.0–7.5)

## 2020-01-11 LAB — CULTURE, URINE COMPREHENSIVE

## 2020-01-15 ENCOUNTER — Telehealth: Payer: Self-pay | Admitting: *Deleted

## 2020-01-15 MED ORDER — SULFAMETHOXAZOLE-TRIMETHOPRIM 800-160 MG PO TABS: 1.0000 | ORAL_TABLET | Freq: Two times a day (BID) | ORAL | 0 refills | Status: DC

## 2020-01-15 NOTE — Telephone Encounter (Addendum)
Discussed with Patient and wife, reviewed in detail. Needs CT complete prior to cysto appointment with Dr. Apolinar Junes. Information given to patient to schedule CT, RX sent in to pharmacy as requested. Verbalized understanding.    ----- Message from Carman Ching, PA-C sent at 01/14/2020 12:49 PM EST ----- Likely contaminant, however will treat given history of microscopic hematuria.  Recommend Bactrim DS twice daily x5 days. ----- Message ----- From: Sarita Bottom, CMA Sent: 01/14/2020  11:13 AM EST To: Carman Ching, PA-C  Please advise appropriate RX  ----- Message ----- From: Nell Range Lab Results In Sent: 01/04/2020   5:38 AM EST To: [name], MD

## 2020-02-04 ENCOUNTER — Ambulatory Visit
Admission: RE | Admit: 2020-02-04 | Discharge: 2020-02-04 | Disposition: A | Discharge status: Home / Self Care | Payer: Medicare Other | Source: Ambulatory Visit | Attending: Urology | Admitting: Urology

## 2020-02-04 ENCOUNTER — Other Ambulatory Visit: Payer: Self-pay

## 2020-02-04 DIAGNOSIS — R3129 Other microscopic hematuria: Secondary | ICD-10-CM | POA: Diagnosis not present

## 2020-02-04 LAB — POCT I-STAT CREATININE: Creatinine, Ser: 1.1 mg/dL (ref 0.61–1.24)

## 2020-02-04 MED ORDER — IOHEXOL 300 MG/ML  SOLN
125.0000 mL | Freq: Once | INTRAMUSCULAR | Status: AC | PRN
  Administered 2020-02-04: 100 mL via INTRAVENOUS

## 2020-02-06 ENCOUNTER — Encounter: Payer: Self-pay | Admitting: Urology

## 2020-02-06 ENCOUNTER — Ambulatory Visit (INDEPENDENT_AMBULATORY_CARE_PROVIDER_SITE_OTHER): Payer: Medicare Other | Admitting: Urology

## 2020-02-06 ENCOUNTER — Other Ambulatory Visit: Payer: Self-pay | Admitting: Urology

## 2020-02-06 ENCOUNTER — Other Ambulatory Visit: Payer: Self-pay

## 2020-02-06 VITALS — BP 138/88 | HR 99

## 2020-02-06 DIAGNOSIS — R3129 Other microscopic hematuria: Secondary | ICD-10-CM

## 2020-02-06 DIAGNOSIS — N3000 Acute cystitis without hematuria: Secondary | ICD-10-CM

## 2020-02-06 DIAGNOSIS — N138 Other obstructive and reflux uropathy: Secondary | ICD-10-CM

## 2020-02-06 DIAGNOSIS — N401 Enlarged prostate with lower urinary tract symptoms: Secondary | ICD-10-CM

## 2020-02-06 MED ORDER — SULFAMETHOXAZOLE-TRIMETHOPRIM 800-160 MG PO TABS
1.0000 | ORAL_TABLET | Freq: Once | ORAL | Status: AC
  Administered 2020-02-06: 1 via ORAL

## 2020-02-06 MED ORDER — SULFAMETHOXAZOLE-TRIMETHOPRIM 800-160 MG PO TABS: 1.0000 | ORAL_TABLET | Freq: Two times a day (BID) | ORAL | Status: DC

## 2020-02-06 NOTE — Progress Notes (Signed)
   02/06/20  CC:  Chief Complaint  Patient presents with  . Cysto    HPI: 86 year old male with microscopic hematuria who returns to the office today for cystoscopic evaluation.  In the interim, he underwent CT urogram completed on 02/04/2020.  This indicated a TURP defect with a thickened bladder wall and 2 diverticula.  No other GU pathology was identified.  He denies any urinary issues.  He did grow a low colony count of Serratia in his urine.  As a precaution, he was prescribed days.  He does recall having a TURP with Dr. Achilles Dunk.   NED. A&Ox3.   No respiratory distress   Abd soft, NT, ND Normal phallus with bilateral descended testicles  Cystoscopy Procedure Note  Patient identification was confirmed, informed consent was obtained, and patient was prepped using Betadine solution.  Lidocaine jelly was administered per urethral meatus.     Pre-Procedure: - Inspection reveals a normal caliber ureteral meatus.  Procedure: The flexible cystoscope was introduced without difficulty - No urethral strictures/lesions are present. - Enlarged prostate which is irregular with nodular regrowth status post TURP with TURP defect appreciated - Open bladder neck - Bilateral ureteral orifices not identified - Bladder mucosa  reveals no ulcers, tumors, or lesions - No bladder stones -Moderate/severe trabeculation with 2 diverticula on the left lateral bladder wall, 1 very small 16 French opening and a second larger more slitlike opening  Moderate degree  Retroflexion shows squamous metaplasia, 1 side of bladder neck and contralateral bladder neck, nodular prostatic regrowth appreciated within an open TURP defect   Post-Procedure: - Patient tolerated the procedure well  Assessment/ Plan:  1. Microscopic hematuria Cystoscopy today indicative of chronic obstruction but no other obvious pathology including no concern for bladder malignancy  Given the presence of debris in his urine  today, I did repeat a urine culture also sending urine cytology as a precaution  CT urogram personally reviewed - Cytology, urine (performed at [facility]) - CULTURE, URINE COMPREHENSIVE - sulfamethoxazole-trimethoprim (BACTRIM DS) 800-160 MG per tablet 1 tablet  2. Acute cystitis without hematuria As above, given Bactrim today as a precaution patient, follow-up urine culture and treat as needed-otherwise asymptomatic  3. Benign prostatic hyperplasia with urinary obstruction Sequela of chronic outlet obstruction including prostatic regrowth, heavily trabeculated bladder with diverticula  We will have him return in 6 months to reassess with IPSS/PVR, claims to be asymptomatic but concerns for complications from chronic obstruction, will follow closely   [name], MD

## 2020-02-08 LAB — CYTOLOGY - NON PAP

## 2020-02-10 LAB — CULTURE, URINE COMPREHENSIVE

## 2020-08-05 ENCOUNTER — Ambulatory Visit: Payer: Self-pay | Admitting: Urology

## 2020-08-08 ENCOUNTER — Encounter: Payer: Self-pay | Admitting: Urology

## 2020-10-25 DEATH — deceased

## 2020-10-29 IMAGING — DX PORTABLE CHEST - 1 VIEW
1 series · 1 of 1 positions shown · non-contrast
Comparison: Report 06/05/2015

CLINICAL DATA: Weakness

EXAM:
PORTABLE CHEST 1 VIEW

[chest ap]
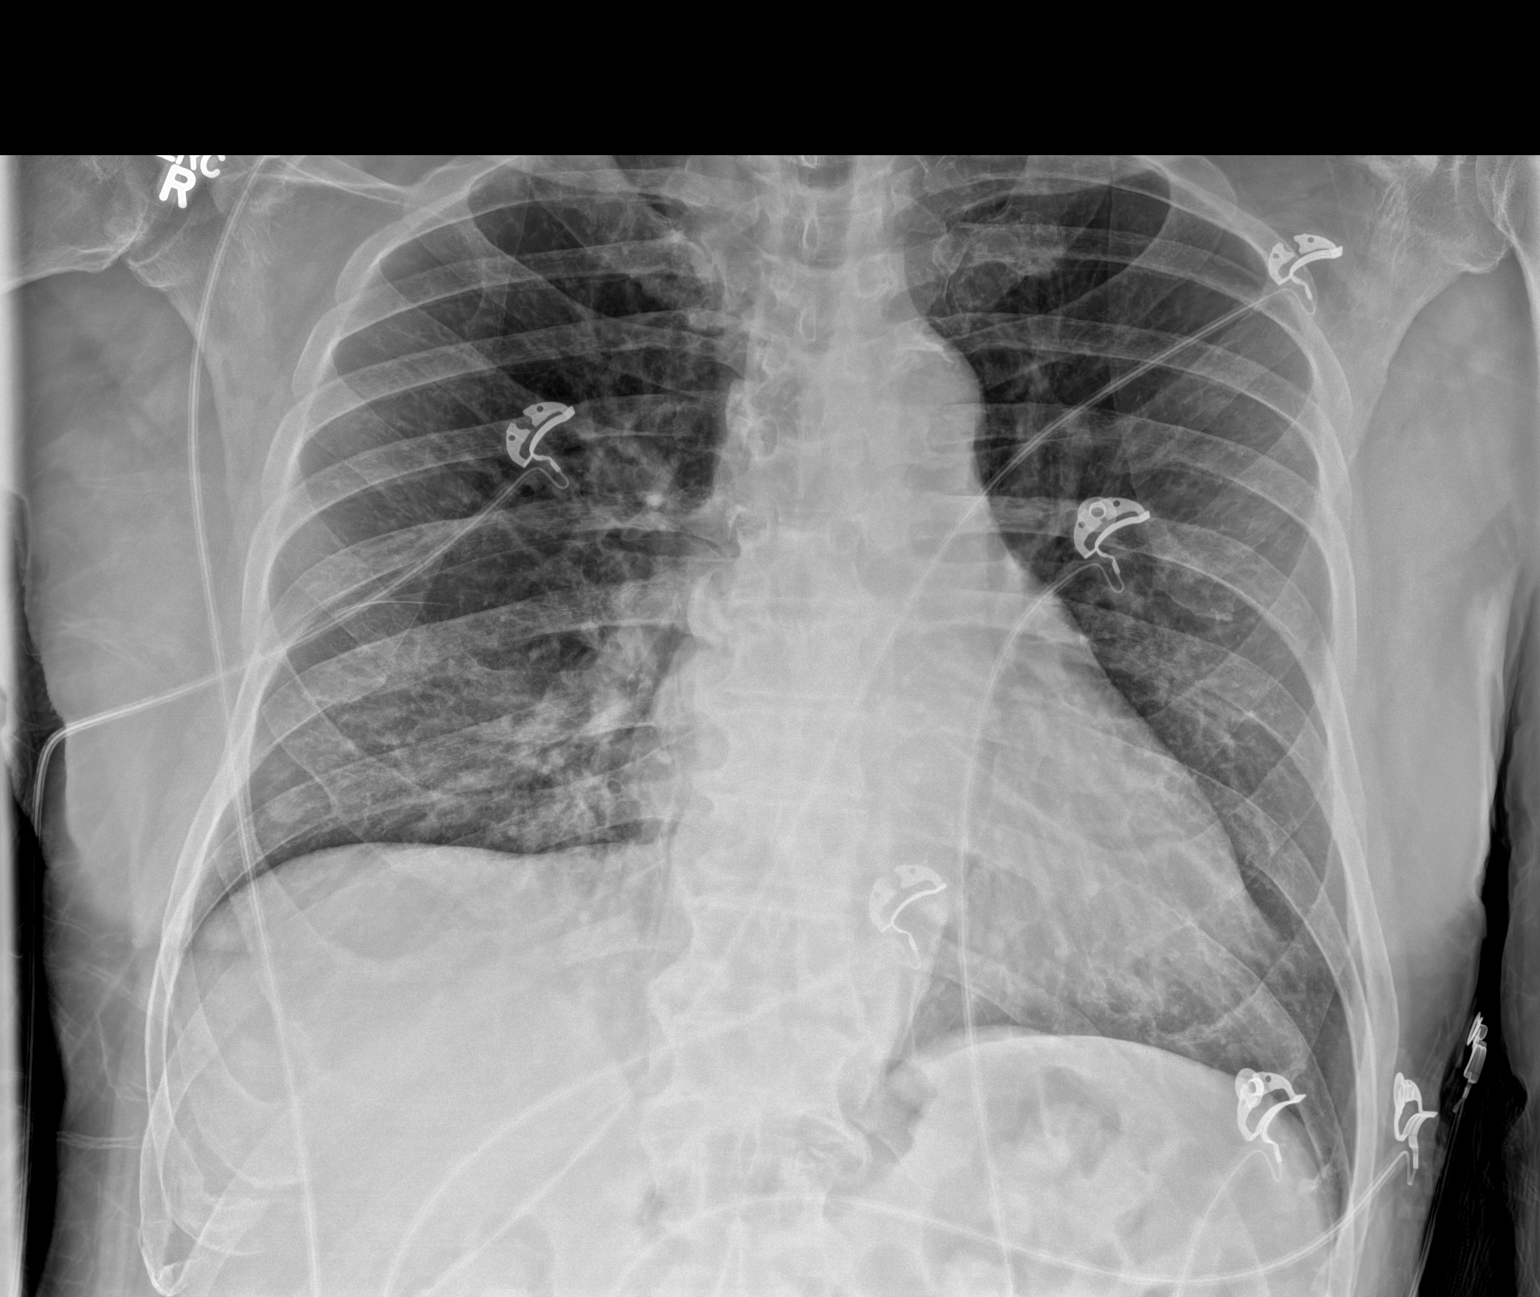

[1 of 1 positions shown; findings below may reference images not displayed]

FINDINGS: No focal consolidation. Mild bronchitic changes. Normal heart size
with aortic atherosclerosis. No pneumothorax.
IMPRESSION: Mild bronchitic changes without acute focal airspace disease

## 2022-06-12 IMAGING — CT CT ABD-PEL WO/W CM
3 of 12 series · 11 of 46 positions shown, 17 images · IV contrast (omnipaque)
Comparison: None.

CLINICAL DATA: Microhematuria.

EXAM:
CT ABDOMEN AND PELVIS WITHOUT AND WITH CONTRAST
TECHNIQUE: Multidetector CT imaging of the abdomen and pelvis was performed
following the standard protocol before and following the bolus
administration of intravenous contrast.
CONTRAST:  100mL OMNIPAQUE IOHEXOL 300 MG/ML  SOLN

[Series 2: abd without pre 5.00 · axial · non-contrast · 0.78mm/px · z∈[-1532,-1172]mm · 7 of 97 slices shown, 12 images]
[im 13/97  soft-tissue]
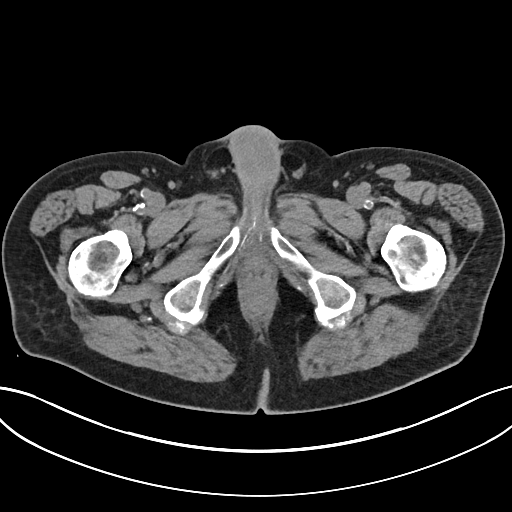
[im 13/97  bone]
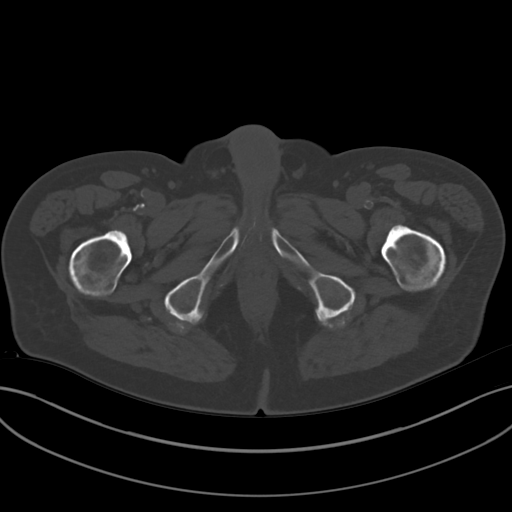
[im 25/97  soft-tissue]
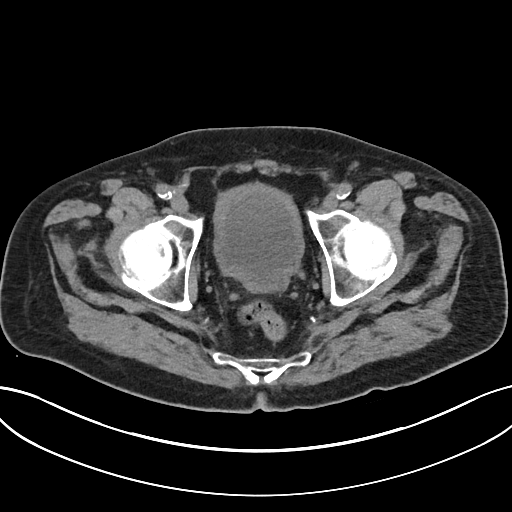
[im 37/97  soft-tissue]
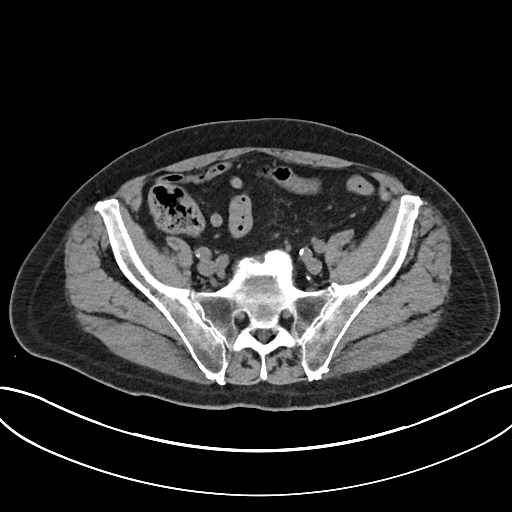
[im 49/97  soft-tissue]
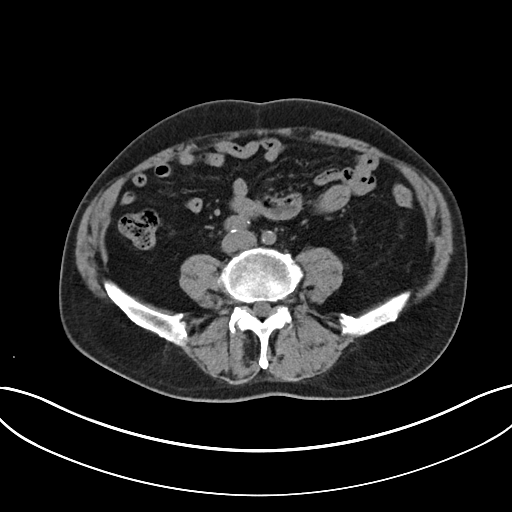
[im 49/97  lung]
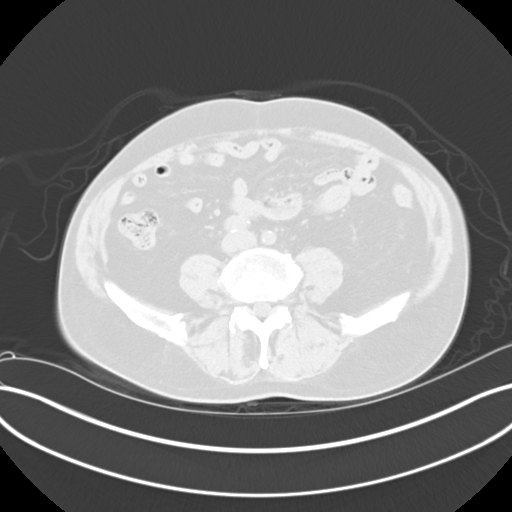
[im 61/97  soft-tissue]
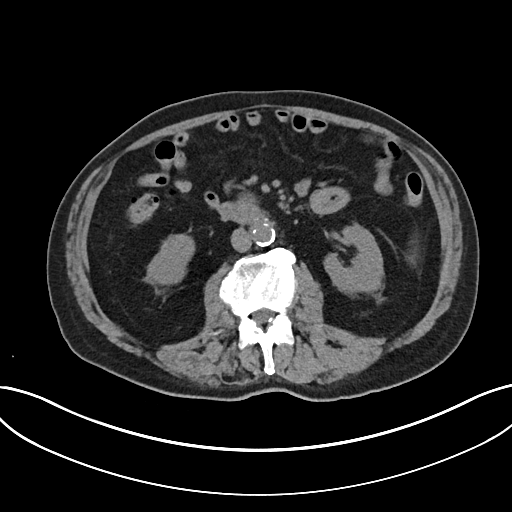
[im 61/97  lung]
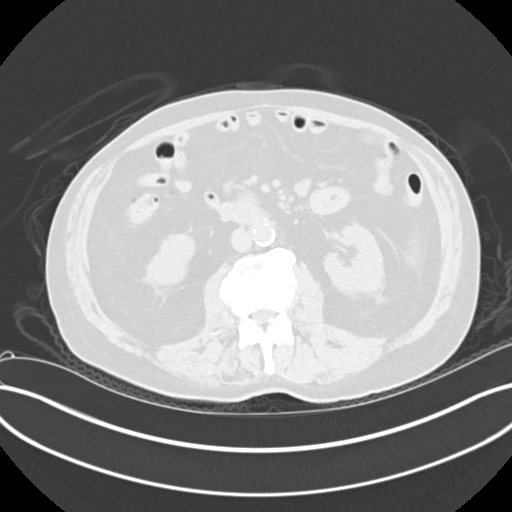
[im 73/97  soft-tissue]
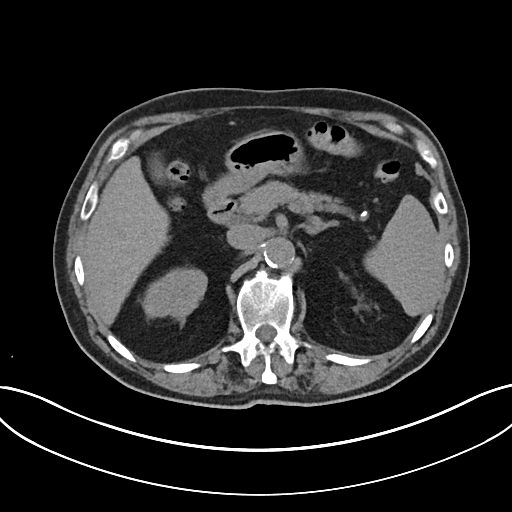
[im 73/97  lung]
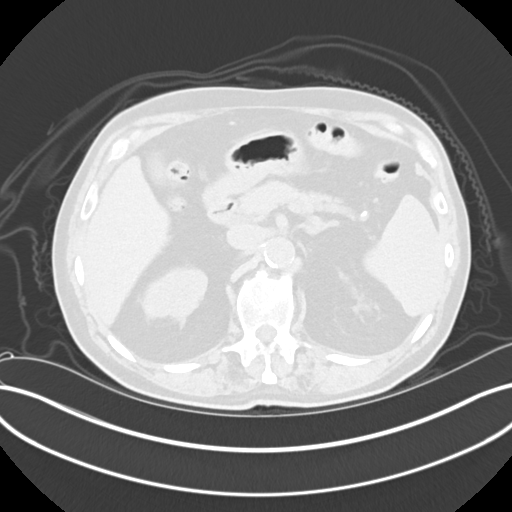
[im 85/97  soft-tissue]
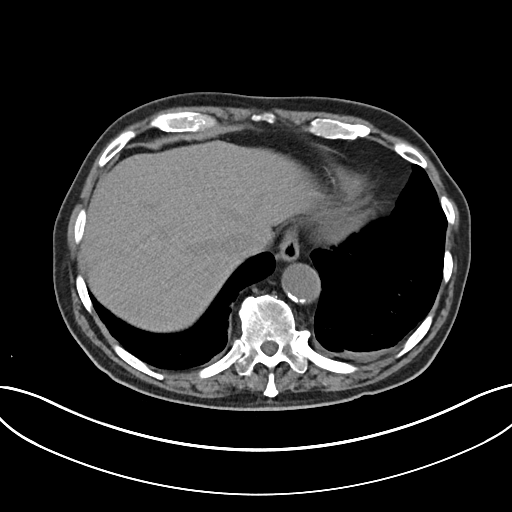
[im 85/97  lung]
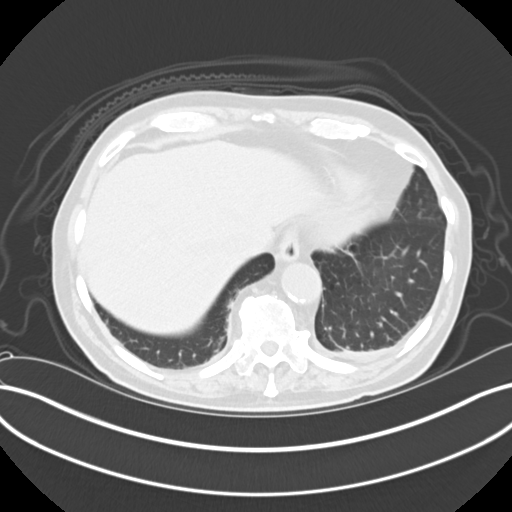

[Series 9: axial with hematuria with 5.00 · axial · 0.73mm/px · z∈[-1487,-1422]mm · 2 of 93 slices shown]
[im 14/93  soft-tissue]
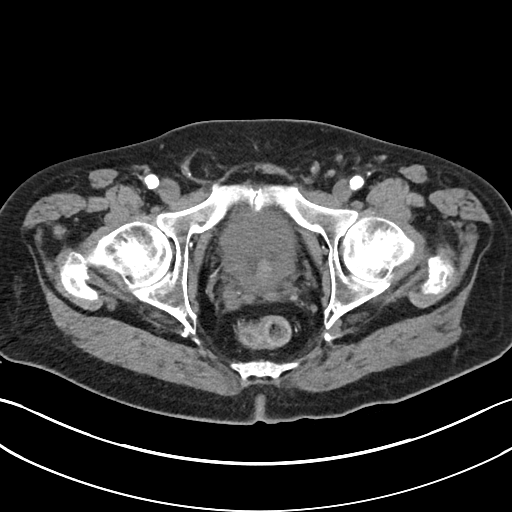
[im 27/93  soft-tissue]
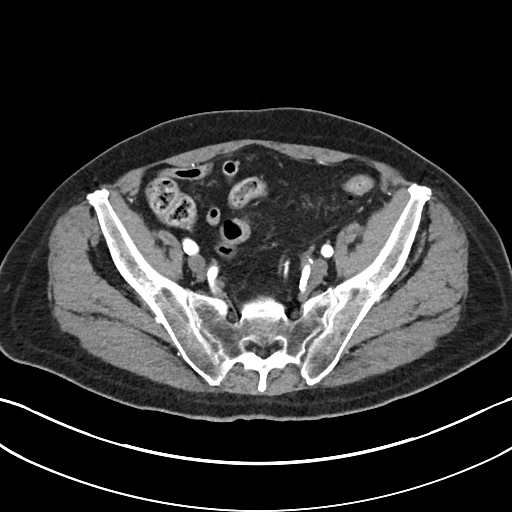

[Series 20: cor delay delay prone 2.00 cor · coronal · delayed · 0.68mm/px · 2 of 142 slices shown, 3 images]
[im 48/142  soft-tissue]
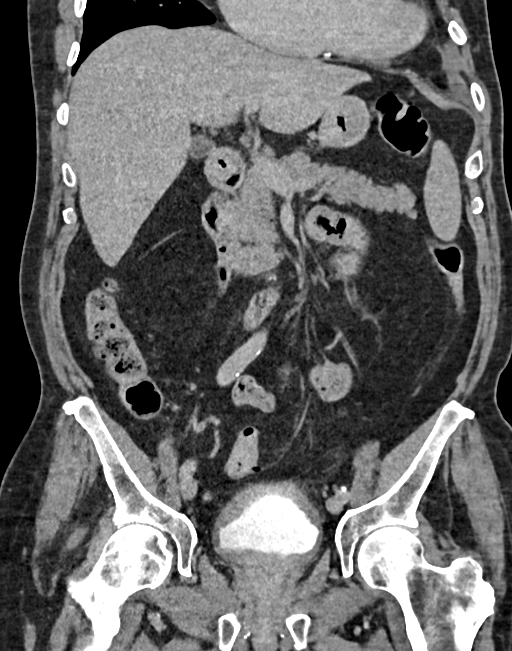
[im 48/142  bone]
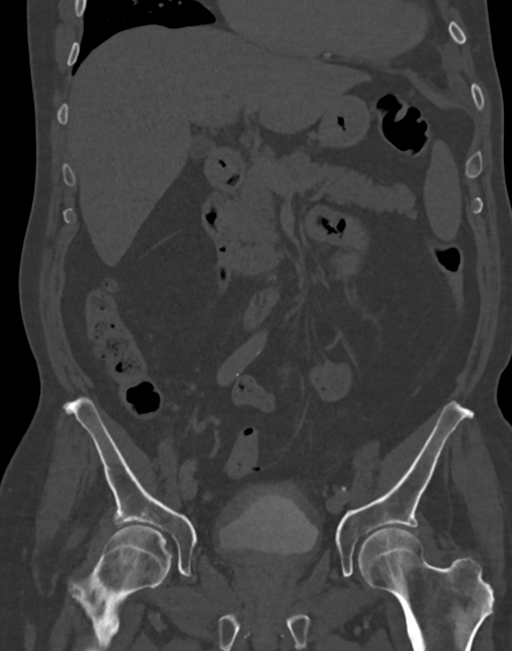
[im 95/142  soft-tissue]
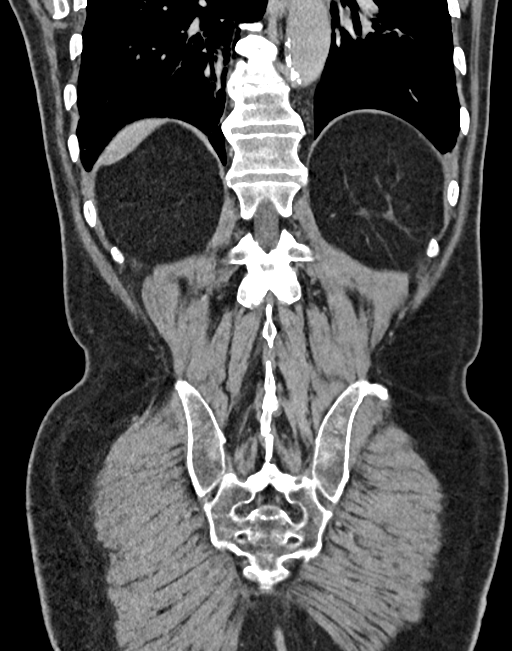

[11 of 46 positions shown; findings below may reference images not displayed]

FINDINGS: Lower chest: The heart is upper limits of normal in size for the
patient's age. Mild enlargement of both the right and left atria.
Dense calcifications noted at the mitral valve annulus. Three-vessel
coronary artery calcifications noted along with aortic
calcifications. No worrisome pulmonary lesions or pulmonary
infiltrates. There is a small left pleural effusion noted.

Hepatobiliary: No hepatic lesions or intrahepatic biliary
dilatation. Small low-attenuation lesion in the left hepatic lobe
likely benign cyst. Gallbladder is unremarkable. No common bile duct
dilatation.

Pancreas: No mass, inflammation or ductal dilatation.

Spleen: Normal size.  No focal lesions.

Adrenals/Urinary Tract: The adrenal glands are unremarkable.

No renal, ureteral or bladder calculi. Both kidneys demonstrate
normal enhancement/perfusion. No worrisome renal lesions. Small
scattered low-attenuation lesions likely benign cysts.

The delayed images do not demonstrate any significant collecting
system abnormalities. Both ureters are normal.

Diffuse but fairly symmetric bladder wall thickening. No bladder
mass is identified. There are 2 left-sided bladder diverticuli.
Suspect prior TURP.

Stomach/Bowel: The stomach, duodenum, small bowel and colon are
grossly normal. No acute inflammatory changes, mass lesions or
obstructive findings. The terminal ileum is normal. The appendix is
normal. Scattered sigmoid colon diverticulosis but no findings for
acute diverticulitis.

Vascular/Lymphatic: Atherosclerotic calcifications involving the
abdominal aorta and iliac arteries and branch vessels. There is a
focal saccular aneurysm involving the infrarenal aorta with maximum
measurement of 2.2 cm.

The major venous structures are patent. No mesenteric or
retroperitoneal mass or adenopathy.

Reproductive: Mild prostate gland enlargement. Suspect prior TURP.
The seminal vesicles are unremarkable.

Other: No pelvic mass or adenopathy. No free pelvic fluid
collections. No inguinal mass or adenopathy. No abdominal wall
hernia or subcutaneous lesions.

Musculoskeletal: No significant bony findings.
IMPRESSION: 1. No CT findings to account for the patient's microhematuria. No
renal, ureteral or bladder calculi or mass.
2. Diffuse but fairly symmetric bladder wall thickening and 2
left-sided bladder diverticuli suggesting chronic bladder outlet
obstruction.
3. Mild prostate gland enlargement with probable prior TURP.
4. Small left pleural effusion.
5. Advanced atherosclerotic calcifications involving the abdominal
aorta and branch vessels including a 2.2 cm saccular aneurysm
involving the infrarenal aorta.

Aortic Atherosclerosis ([id number]).
